# Patient Record
Sex: Female | Born: 1975 | Hispanic: Yes | Marital: Married | Smoking: Never smoker
Health system: Southern US, Community
[De-identification: ages and names within clinical notes are randomized; demographics above are authoritative.]

## PROBLEM LIST (undated history)

## (undated) DIAGNOSIS — E119 Type 2 diabetes mellitus without complications: Secondary | ICD-10-CM

## (undated) HISTORY — PX: CHOLECYSTECTOMY: SHX55

---

## 2006-03-29 ENCOUNTER — Ambulatory Visit: Payer: Self-pay | Admitting: Family Medicine

## 2006-04-11 ENCOUNTER — Ambulatory Visit: Payer: Self-pay | Admitting: Family Medicine

## 2006-04-23 ENCOUNTER — Ambulatory Visit: Payer: Self-pay | Admitting: Family Medicine

## 2006-05-24 ENCOUNTER — Ambulatory Visit: Payer: Self-pay | Admitting: Family Medicine

## 2007-02-28 ENCOUNTER — Emergency Department: Payer: Self-pay | Admitting: Emergency Medicine

## 2011-07-14 ENCOUNTER — Ambulatory Visit: Payer: Self-pay | Admitting: Family Medicine

## 2011-07-16 ENCOUNTER — Emergency Department: Payer: Self-pay | Admitting: Internal Medicine

## 2017-01-22 ENCOUNTER — Ambulatory Visit
Admission: RE | Admit: 2017-01-22 | Discharge: 2017-01-22 | Disposition: A | Discharge status: Home / Self Care | Payer: 59 | Source: Ambulatory Visit | Attending: Obstetrics and Gynecology | Admitting: Obstetrics and Gynecology

## 2017-01-22 ENCOUNTER — Encounter: Payer: Self-pay | Admitting: *Deleted

## 2017-01-22 ENCOUNTER — Other Ambulatory Visit: Payer: Self-pay | Admitting: *Deleted

## 2017-01-22 DIAGNOSIS — O24111 Pre-existing diabetes mellitus, type 2, in pregnancy, first trimester: Secondary | ICD-10-CM | POA: Insufficient documentation

## 2017-01-22 DIAGNOSIS — O99211 Obesity complicating pregnancy, first trimester: Secondary | ICD-10-CM | POA: Diagnosis not present

## 2017-01-22 DIAGNOSIS — O09521 Supervision of elderly multigravida, first trimester: Secondary | ICD-10-CM | POA: Diagnosis not present

## 2017-01-22 DIAGNOSIS — E669 Obesity, unspecified: Secondary | ICD-10-CM | POA: Insufficient documentation

## 2017-01-22 DIAGNOSIS — Z6828 Body mass index (BMI) 28.0-28.9, adult: Secondary | ICD-10-CM | POA: Diagnosis not present

## 2017-01-22 DIAGNOSIS — O24919 Unspecified diabetes mellitus in pregnancy, unspecified trimester: Secondary | ICD-10-CM | POA: Insufficient documentation

## 2017-01-22 DIAGNOSIS — O09529 Supervision of elderly multigravida, unspecified trimester: Secondary | ICD-10-CM

## 2017-01-22 DIAGNOSIS — Z833 Family history of diabetes mellitus: Secondary | ICD-10-CM | POA: Diagnosis not present

## 2017-01-22 DIAGNOSIS — Z3A09 9 weeks gestation of pregnancy: Secondary | ICD-10-CM | POA: Insufficient documentation

## 2017-01-22 HISTORY — DX: Type 2 diabetes mellitus without complications: E11.9

## 2017-01-22 LAB — GLUCOSE, CAPILLARY: GLUCOSE-CAPILLARY: 271 mg/dL — AB (ref 65–99)

## 2017-01-22 MED ORDER — ACCU-CHEK MULTICLIX LANCETS MISC: 5 refills | Status: DC

## 2017-01-22 MED ORDER — INSULIN GLARGINE 100 UNIT/ML ~~LOC~~ SOLN: SUBCUTANEOUS | 3 refills | Status: AC

## 2017-01-22 MED ORDER — INSULIN LISPRO 100 UNIT/ML ~~LOC~~ SOLN: SUBCUTANEOUS | 3 refills | Status: AC

## 2017-01-22 MED ORDER — GLUCAGON (RDNA) 1 MG IJ KIT: PACK | INTRAMUSCULAR | 1 refills | Status: AC

## 2017-01-22 MED ORDER — GLUCOSE BLOOD VI STRP: ORAL_STRIP | 12 refills | Status: DC

## 2017-01-22 MED ORDER — "INSULIN SYRINGE-NEEDLE U-100 29G X 1/2"" 0.3 ML MISC": 4.0000 "application " | Freq: Four times a day (QID) | 3 refills | Status: AC

## 2017-01-22 NOTE — Progress Notes (Signed)
[facility] Consultation   Chief Complaint: diabetes in pregnancy  HPI: [name] is a 41 y.o. G5 p3013   who presents in consultation from  [facility] for T2 DM in prengancy with elevated Hgb A1c 10. LMP 12/03/16 placing her at 7 weeks .   Review of her Care Everywhere shows she was on metformin for her 2014 pregnancy and was switched to  insulin at that time her hgb A1c was 6.4, normal EKG, normal baseline preex labs- no urine protein  , normal ophthal exam then Had a normal SVD EFW had been in 30% ile range and was given OCPs for pp contraception and was restarted on metformin   Pt stopped her metformin and her ACE  (?) inhibitor upon finding out she was pregnant . She is traveling to [place] to visit relatives for 6 weeks 7/12-8/25 . She is not interested in being admitted for glucose control since she feels she learned the skills she needs to care for her diabetes in her last pregnancy. She understands the idea of short and long acting insulin. She repeated how to draw insulin up in a syringe after injecting air into the vial .  We did not review site rotation. Pt understands the idea of hypoglycemia if she injects and does not eat. And she requests a rx for glucagon as she had in the last pregnancy though she never really experienced hypoglycemia in her prior pregnancy.  We discussed that bringing down her sugar may result in feeling shaky even when her blood sugars read in a normal range since she is used to running high.   Past Medical History: Patient has a h/o type 2 DM since 2008 pregnancy Past Surgical History: cholecystectomy 2008 Obstetric History:   2014 SVD 39 2/7 6 lb 10 oz female  epidural  DM on insulin  2012 - sab 2008  38 weeks SVD 6 lb 2oz diabetes (here with her today)  2001 term 38 weeks 7lb 3 oz SVD    Gynecologic History: distant h/o LSIL neg colpo 2008, normal pap 2012 listed on Care everywhere, neg 2009, 2008 , 2007    Medications:  Allergies:  Social History: Patient    Family History: diabetes in father and grandmother  Blood type  -O pos in 2014 , imm to rubella then Review of SystemsPhysical Exam: Vitals:   01/22/17 1100  BP: 124/79  Pulse: 81  Resp: 16  Temp: 98.3 F (36.8 C)  Petite mildly obese latina female  RBS 270  See u/s report  SLIUP at 9w 4d yolk sac at 6mm upper limits of normal  Asessement: IUP at 9w 4d by today's scan  1. Elderly multigravida with antepartum condition or complication   2. Pre-existing type 2 diabetes mellitus during pregnancy in first trimester   3. Obesity (BMI 30-39.9)   4. Distant h/o LSIL  Plan: 1 . Start insulin - pt has used before- will start approximately 0.5 units /kg and increase next week if pt tolerates-  pt declines hospitlaization Will begin with lantus 10 units qhs and administer 5 units humalog  before each meal  Pt given sheets for recording blood sugar I wrote down insulin doses and reviewed with interpreter  2. genetic counseling for AMA next week  3.first tri  U/s next month  - pt at risk for congenital defects due to hgb a1c and her age - offer level 2 scan and fetal echo  Growth scans q 4  w in third trimester and twice weekly testing at 32 w , delivery at 37-39 weeks depending on glucose control , fetal growth .  4 baseline labs including CMP  , EKG, P/C ratio, ophthalmology visit - I did not order - hopefully pt can get through [facility]  Otherwise will order at next visit  5 offer baby aspirin at 5311-14  w  given age and DM  6 Confirm recent pap done - not performed today  I offered to follow her insulin here or referral to either [facility] or [facility] (pt delivered there last time). Pt will f/u in one week and after she gets back from [place] . I reviewed care of insulin - avoiding extreme heat, pt has glucometer, Rx for lancets and strips given - she has [employer] [employer] [facility] - hopefully will cover additional meds and strips for her trip.          Total time spent with the patient was 30 minutes with greater than 50% spent in counseling and coordination of care. We appreciate this interesting consult and will be happy to be involved in the ongoing care of [name] in anyway her obstetricians desire.  Jimmey RalphLivingston, Edan Serratore, MD Maternal-Fetal Medicine [facility]

## 2017-01-29 ENCOUNTER — Ambulatory Visit
Admission: RE | Admit: 2017-01-29 | Discharge: 2017-01-29 | Disposition: A | Discharge status: Home / Self Care | Payer: 59 | Source: Ambulatory Visit | Attending: Obstetrics and Gynecology | Admitting: Obstetrics and Gynecology

## 2017-01-29 ENCOUNTER — Ambulatory Visit (HOSPITAL_BASED_OUTPATIENT_CLINIC_OR_DEPARTMENT_OTHER)
Admission: RE | Admit: 2017-01-29 | Discharge: 2017-01-29 | Disposition: A | Discharge status: Home / Self Care | Payer: 59 | Source: Ambulatory Visit | Attending: Obstetrics and Gynecology | Admitting: Obstetrics and Gynecology

## 2017-01-29 VITALS — BP 121/70 | HR 90 | Temp 98.4°F | Resp 17 | Ht 60.0 in | Wt 150.2 lb

## 2017-01-29 DIAGNOSIS — Z794 Long term (current) use of insulin: Secondary | ICD-10-CM | POA: Insufficient documentation

## 2017-01-29 DIAGNOSIS — Z3A10 10 weeks gestation of pregnancy: Secondary | ICD-10-CM | POA: Diagnosis not present

## 2017-01-29 DIAGNOSIS — O24111 Pre-existing diabetes mellitus, type 2, in pregnancy, first trimester: Secondary | ICD-10-CM | POA: Diagnosis present

## 2017-01-29 DIAGNOSIS — O09521 Supervision of elderly multigravida, first trimester: Secondary | ICD-10-CM | POA: Diagnosis not present

## 2017-01-29 DIAGNOSIS — O09529 Supervision of elderly multigravida, unspecified trimester: Secondary | ICD-10-CM

## 2017-01-29 LAB — GLUCOSE, CAPILLARY: GLUCOSE-CAPILLARY: 254 mg/dL — AB (ref 65–99)

## 2017-01-29 NOTE — Progress Notes (Addendum)
Referring Provider:  [facility] Length of Consultation: 50 minutes  Ms. Kanyon Bunn was referred to [facility] for genetic counseling because of advanced maternal age.  The patient will be 41 years old at the time of delivery.  This note summarizes the information we discussed with the aid of a Spanish interpreter.  The patient was counseled by Andrez Grime, genetic counseling intern, supervised by Katrina Stack, MS, CGC.    We explained that the chance of a chromosome abnormality increases with maternal age.  Chromosomes and examples of chromosome problems were reviewed.  Humans typically have 46 chromosomes in each cell, with half passed through each sperm and egg.  Any change in the number or structure of chromosomes can increase the risk of problems in the physical and mental development of a pregnancy.   Based upon age of the patient, the chance of any chromosome abnormality was 1 in 22. The chance of Down syndrome, the most common chromosome problem associated with maternal age, was 1 in 84.  The risk of chromosome problems is in addition to the 3% general population risk for birth defects and mental retardation.  The greatest chance, of course, is that the baby would be born in good health.  We discussed the following prenatal screening and testing options for this pregnancy:  First trimester screening, which includes nuchal translucency ultrasound screen and first trimester maternal serum marker screening.  The nuchal translucency has approximately an 80% detection rate for Down syndrome and can be positive for other chromosome abnormalities as well as heart defects.  When combined with a maternal serum marker screening, the detection rate is up to 90% for Down syndrome and up to 97% for trisomy 18.     The chorionic villus sampling procedure is available for first trimester chromosome analysis.  This involves the withdrawal of a small amount of  chorionic villi (tissue from the developing placenta).  Risk of pregnancy loss is estimated to be approximately 1 in 200 to 1 in 100 (0.5 to 1%).  There is approximately a 1% (1 in 100) chance that the CVS chromosome results will be unclear.  Chorionic villi cannot be tested for neural tube defects.     Maternal serum marker screening, a blood test that measures pregnancy proteins, can provide risk assessments for Down syndrome, trisomy 18, and open neural tube defects (spina bifida, anencephaly). Because it does not directly examine the fetus, it cannot positively diagnose or rule out these problems.  Targeted ultrasound uses high frequency sound waves to create an image of the developing fetus.  An ultrasound is often recommended as a routine means of evaluating the pregnancy.  It is also used to screen for fetal anatomy problems (for example, a heart defect) that might be suggestive of a chromosomal or other abnormality.   Amniocentesis involves the removal of a small amount of amniotic fluid from the sac surrounding the fetus with the use of a thin needle inserted through the maternal abdomen and uterus.  Ultrasound guidance is used throughout the procedure.  Fetal cells from amniotic fluid are directly evaluated and > 99.5% of chromosome problems and > 98% of open neural tube defects can be detected. This procedure is generally performed after the 15th week of pregnancy.  The main risks to this procedure include complications leading to miscarriage in less than 1 in 200 cases (0.5%).  We also reviewed the availability of cell free fetal DNA testing from maternal blood to  determine whether or not the baby may have either Down syndrome, trisomy 4913, or trisomy 8018.  This test utilizes a maternal blood sample and DNA sequencing technology to isolate circulating cell free fetal DNA from maternal plasma.  The fetal DNA can then be analyzed for DNA sequences that are derived from the three most common  chromosomes involved in aneuploidy, chromosomes 13, 18, and 21.  If the overall amount of DNA is greater than the expected level for any of these chromosomes, aneuploidy is suspected.  While we do not consider it a replacement for invasive testing and karyotype analysis, a negative result from this testing would be reassuring, though not a guarantee of a normal chromosome complement for the baby.  An abnormal result is certainly suggestive of an abnormal chromosome complement, though we would still recommend CVS or amniocentesis to confirm any findings from this testing.  Cystic Fibrosis and Spinal Muscular Atrophy (SMA) screening were also discussed with the patient. Both conditions are recessive, which means that both parents must be carriers in order to have a child with the disease.  Cystic fibrosis (CF) is one of the most common genetic conditions in persons of Caucasian ancestry.  This condition occurs in approximately 1 in 2,500 Caucasian persons and results in thickened secretions in the lungs, digestive, and reproductive systems.  For a baby to be at risk for having CF, both of the parents must be carriers for this condition.  Approximately 1 in 6725 Caucasian persons is a carrier for CF.  Current carrier testing looks for the most common mutations in the gene for CF and can detect approximately 90% of carriers in the Caucasian population.  This means that the carrier screening can greatly reduce, but cannot eliminate, the chance for an individual to have a child with CF.  If an individual is found to be a carrier for CF, then carrier testing would be available for the partner. As part of [place]'s newborn screening profile, all babies born in the state of [place]  will have a two-tier screening process.  Specimens are first tested to determine the concentration of immunoreactive trypsinogen (IRT).  The top 5% of specimens with the highest IRT values then undergo DNA testing using a panel of  over 40 common CF mutations. SMA is a neurodegenerative disorder that leads to atrophy of skeletal muscle and overall weakness.  This condition is also more prevalent in the Caucasian population, with 1 in 40-1 in 60 persons being a carrier and 1 in 6,000-1 in 10,000 children being affected.  There are multiple forms of the disease, with some causing death in infancy to other forms with survival into adulthood.  The genetics of SMA is complex, but carrier screening can detect up to 95% of carriers in the Caucasian population.  Similar to CF, a negative result can greatly reduce, but cannot eliminate, the chance to have a child with SMA.  We obtained a detailed family history and pregnancy history.  The patient reported one paternal uncle and one paternal first cousin with seizures.  The cause for their seizures is not known, but neither has any other health or developmental concerns.We reviewed that seizures may have many causes, including some which may be inherited.  If more is learned about the cause for their conditions, we are happy to discuss this further. She also stated that two of her sons have benign heart murmers.  They have no restrictions or medications and are otherwise in good health. The remainder  of the family history is unremarkable for birth defects, developmental delays, recurrent pregnancy loss or known chromosome abnormalities.  Ms. Tira Lafferty stated that this is her fourth pregnancy.  She and her husband have two sons and a daughter with normal growth and development.  She was also seen today for an MFM consultation due to poorly controlled diabetes.  We reviewed that increased risk for birth defects related to maternal diabetes and the need for a detailed ultrasound, AFP testing for open neural tube defects, and a fetal echocardiogram after [redacted] weeks gestation.    After consideration of the options, Ms. Ermel Verne elected to proceed with cell free fetal DNA testing today.  She  declined CF and SMA carrier screening.  The patient requested that we contact her husband with the cell free DNA results, as she will be out of the country for several weeks.  When she returns in late August, she is scheduled for additional visits here and a detailed anatomy ultrasound.  She may consider amniocentesis as well at that time depending upon the results of her cell free fetal DNA testing.   Ms. Yalissa Fink was encouraged to call with questions or concerns.  We can be contacted at [phone].    Cherly Anderson, MS, CGC

## 2017-01-29 NOTE — Progress Notes (Signed)
[facility]   Chief Complaint: Type 2 diabetes on insulin in pregnancy follow up  HPI: [name] is a 41 y.o. G4P0 at 10w 4  weeks by [facility] u/s on 7/2 with EDC of 08/23/2017  and  who presents in consultation from [facility] for f/u of her blood sugars  I had started her on 10 u lantus 5 units humalog before each meal  Her sugars in her meter are 130 -250  RBS 254   Pt does eat tortillas and traditional [place] foods that she note are high in carbs   Past Medical History: Patient  has a past medical history of Diabetes mellitus without complication (HCC).  Past Surgical History: She  has a past surgical history that includes Cholecystectomy.  Obstetric History:  OB History    Gravida Para Term Preterm AB Living   4         3   SAB TAB Ectopic Multiple Live Births                 Gynecologic History:  Patient's last menstrual period was 11/23/2016.    Medications:   Current Outpatient Prescriptions:  .  glucagon 1 MG injection, Follow package directions for low blood sugar., Disp: 1 each, Rfl: 1 .  glucose blood test strip, Use as instructed 4 times daily, Disp: 100 each, Rfl: 12 .  insulin glargine (LANTUS) 100 UNIT/ML injection, 10 units nightly, Disp: 10 mL, Rfl: 3 .  insulin lispro (HUMALOG) 100 UNIT/ML injection, 5 units before each meal, Disp: 10 mL, Rfl: 3 .  Insulin Syringe-Needle U-100 (B-D INS SYR ULTRAFINE .3CC/29G) 29G X 1/2" 0.3 ML MISC, Inject 4 application into the skin 4 (four) times daily., Disp: 120 each, Rfl: 3 .  Lancets (ACCU-CHEK MULTICLIX) lancets, Use as directed, Disp: 100 each, Rfl: 5 .  Prenatal Vit-Fe Fumarate-FA (PRENATAL MULTIVITAMIN) TABS tablet, Take 1 tablet by mouth daily at 12 noon., Disp: , Rfl:  Allergies: Patient has No Known Allergies.  Social History: Patient  reports that she has never smoked. She has never used smokeless tobacco. She reports that she does not drink alcohol or use drugs.   Family History: family history includes Diabetes in her father.  Review of Systems A full 12 point review of systems was negative or as noted in the History of Present Illness.  Physical Exam: LMP 11/23/2016  RBS 254 Informal scan FHR 150  Asessement: 1. Elderly multigravida with antepartum condition or complication   2. Pre-existing type 2 diabetes mellitus during pregnancy in first trimester     Plan: We discussed consistent diet avoiding  simple carbs and limiting quantity of complex carbs  , charting her glucoses to  Look for trends I recommended we increase her insulin.  Pt was anxious to increase insulin too quickly so I dropped increase  We compromised on  14 units lantus 8/8/8 humalog  I reviewed target blood sugars <120-130 after meals , < 100 fbs  Pt will be in [place] over next 4-6 weeks  I suggested she could  increase insulin dose by 2 across the day if her blood sugars remain in the 200s (ie 16 lantus 05/02/09 of humalog )  I reviewed the danger of using too much insulin and dropping the blood sugar too low and the idea is to cover trends in the blood sugar .  I recommended consistent physical activity like walking to assist in glycemic control. I told her if  her sugars went into the high 200s 300s she should seek medical care there in [place]. Likewise if she experiences bleeding she should seek care.   Total time spent with the patient was 30 minutes with greater than 50% spent in counseling and coordination of care. We appreciate this interesting consult and will be happy to be involved in the ongoing care of [name] in anyway her obstetricians desire.  Jimmey RalphLivingston, Sujata Maines, MD Maternal-Fetal Medicine [facility]

## 2017-02-02 LAB — INFORMASEQ(SM) WITH XY ANALYSIS
FETAL FRACTION (%): 4.6
FETAL NUMBER: 1
GESTATIONAL AGE AT COLLECTION: 10.6 wk
Weight: 150 [lb_av]

## 2017-02-05 ENCOUNTER — Ambulatory Visit: Payer: 59

## 2017-02-05 ENCOUNTER — Telehealth: Payer: Self-pay | Admitting: Obstetrics and Gynecology

## 2017-02-05 NOTE — Telephone Encounter (Signed)
The patient's husband, Arlyss RepressMoises, was informed of the results of her recent InformaSeq testing (performed at [facility]) which yielded NEGATIVE results.  The patient's specimen showed DNA consistent with two copies of chromosomes 21, 18 and 13.  The sensitivity for trisomy 2021, trisomy 5518 and trisomy 6213 using this testing are reported as 99.1%, 98.3% and 98.1% respectively.  Thus, while the results of this testing are highly accurate, they are not considered diagnostic at this time.  Should more definitive information be desired, the patient may still consider amniocentesis.   As requested to know by the patient, sex chromosome analysis was included for this sample.  Results was consistent with a female (XY) fetus. This is predicted with >99% accuracy.  A maternal serum AFP only should be considered if screening for neural tube defects is desired.  The patient requested we provide the results to her husband, as she is currently out of the country for several weeks.  We spoke with Moises with the aid of a Spanish interpreter.  Cherly Andersoneborah F. Maecyn Panning, MS, CGC

## 2017-03-15 ENCOUNTER — Other Ambulatory Visit: Payer: Self-pay | Admitting: *Deleted

## 2017-03-15 DIAGNOSIS — O09529 Supervision of elderly multigravida, unspecified trimester: Secondary | ICD-10-CM

## 2017-03-15 DIAGNOSIS — O24912 Unspecified diabetes mellitus in pregnancy, second trimester: Secondary | ICD-10-CM

## 2017-03-19 ENCOUNTER — Ambulatory Visit
Admission: RE | Admit: 2017-03-19 | Discharge: 2017-03-19 | Disposition: A | Discharge status: Home / Self Care | Payer: 59 | Source: Ambulatory Visit | Attending: Obstetrics & Gynecology | Admitting: Obstetrics & Gynecology

## 2017-03-19 DIAGNOSIS — Z3A17 17 weeks gestation of pregnancy: Secondary | ICD-10-CM | POA: Insufficient documentation

## 2017-03-19 DIAGNOSIS — O24912 Unspecified diabetes mellitus in pregnancy, second trimester: Secondary | ICD-10-CM | POA: Insufficient documentation

## 2017-03-19 DIAGNOSIS — O09529 Supervision of elderly multigravida, unspecified trimester: Secondary | ICD-10-CM | POA: Insufficient documentation

## 2017-03-19 DIAGNOSIS — O24919 Unspecified diabetes mellitus in pregnancy, unspecified trimester: Secondary | ICD-10-CM | POA: Diagnosis present

## 2017-03-19 LAB — GLUCOSE, CAPILLARY: GLUCOSE-CAPILLARY: 255 mg/dL — AB (ref 65–99)

## 2017-03-19 LAB — HEMOGLOBIN A1C
Hgb A1c MFr Bld: 7.8 % — ABNORMAL HIGH (ref 4.8–5.6)
Mean Plasma Glucose: 177.16 mg/dL

## 2017-03-29 ENCOUNTER — Ambulatory Visit
Admission: RE | Admit: 2017-03-29 | Discharge: 2017-03-29 | Disposition: A | Discharge status: Home / Self Care | Payer: 59 | Source: Ambulatory Visit | Attending: Maternal & Fetal Medicine | Admitting: Maternal & Fetal Medicine

## 2017-03-29 VITALS — BP 119/67 | HR 94 | Temp 98.2°F | Resp 18 | Wt 158.6 lb

## 2017-03-29 DIAGNOSIS — O24919 Unspecified diabetes mellitus in pregnancy, unspecified trimester: Secondary | ICD-10-CM

## 2017-03-29 NOTE — Progress Notes (Signed)
MFM note addendum  FHR 150s  Visit was completed with assistance of spanish medical interpreter.

## 2017-03-29 NOTE — Progress Notes (Signed)
MFM follow up consultation  41 yo G4P0 at 19 weeks by [facility] on [facility]/2 with EDC of 08/23/17 here for consultation due to T2 DM.  She was in [place] for 4 weeks during this pregnancy--returned on Aug. 25th--[place]. She reports multiple mosquito bites. Works in [employer] and arrives at work at [employer]---having difficulty checking blood glucose post breakfast.   On lantus 15 u and Humalog 07/05/11   Fastings 110--133  NOT recording post breakfast  Post lunch  122-185 (10/11 out of range) Post dinner 144-165     1. Recommended she take breakfast earlier in am (wakes at 530a daily) and check blood glucose prior to leaving house.  -- Insulin adjustments: increase evening lantus to 20u Increase humalog to 07/09/15 RTC in one week for blood glucose review --Needs follow up growth scan in ~10 weeks  --Will need fetal echocardiogram ~22weeks--did not want to schedule yet due to concerns for payment --She did not have follow up visit at [facility] but we called to make appt--she will obtain referral to Obgyn for prenatal care ([facility])  2. ZKV screening---please obtain ZIKV screening as part of her PN exposure--we discussed today (given her recent travel/mosquito bite exposure) and she is amenable to testing.   --if positive, we can address further surveillance with patient

## 2017-04-05 ENCOUNTER — Ambulatory Visit: Payer: 59

## 2017-04-05 ENCOUNTER — Ambulatory Visit
Admission: RE | Admit: 2017-04-05 | Discharge: 2017-04-05 | Disposition: A | Discharge status: Home / Self Care | Payer: 59 | Source: Ambulatory Visit | Attending: Maternal & Fetal Medicine | Admitting: Maternal & Fetal Medicine

## 2017-04-05 ENCOUNTER — Telehealth: Payer: Self-pay | Admitting: *Deleted

## 2017-04-05 VITALS — BP 112/66 | HR 96 | Temp 98.7°F | Resp 18

## 2017-04-05 DIAGNOSIS — Z794 Long term (current) use of insulin: Secondary | ICD-10-CM | POA: Insufficient documentation

## 2017-04-05 DIAGNOSIS — E669 Obesity, unspecified: Secondary | ICD-10-CM

## 2017-04-05 DIAGNOSIS — O09529 Supervision of elderly multigravida, unspecified trimester: Secondary | ICD-10-CM

## 2017-04-05 DIAGNOSIS — Z3A20 20 weeks gestation of pregnancy: Secondary | ICD-10-CM | POA: Diagnosis not present

## 2017-04-05 DIAGNOSIS — O24912 Unspecified diabetes mellitus in pregnancy, second trimester: Secondary | ICD-10-CM | POA: Insufficient documentation

## 2017-04-05 DIAGNOSIS — O24919 Unspecified diabetes mellitus in pregnancy, unspecified trimester: Secondary | ICD-10-CM

## 2017-04-05 LAB — GLUCOSE, CAPILLARY: GLUCOSE-CAPILLARY: 133 mg/dL — AB (ref 65–99)

## 2017-04-05 NOTE — Telephone Encounter (Signed)
Called [facility] to schedule prenatal appt. [facility].  Appointment scheduled for 04/06/2017 at 10:45.  Patient aware.

## 2017-04-05 NOTE — Progress Notes (Signed)
MFM follow up consultation  41 yo G5P3013 at 20 weeks by [facility]  [facility]; measurements were consistent with 9 weeks 4 days.  She's  here for follow up consultation due to T2DM.  She is currently on Lantus 20u at HS and Humalog 07/09/15  FHR 130s   Vitals:   04/05/17 0839  BP: 112/66  Pulse: 96  Resp: 18  Temp: 98.7 F (37.1 C)  SpO2: 98%   Current Outpatient Prescriptions on File Prior to Encounter  Medication Sig Dispense Refill  . glucagon 1 MG injection Follow package directions for low blood sugar. 1 each 1  . glucose blood test strip Use as instructed 4 times daily 100 each 12  . insulin aspart (NOVOLOG) 100 UNIT/ML injection Inject 12 Units into the skin 3 (three) times daily before meals.    . insulin detemir (LEVEMIR) 100 UNIT/ML injection Inject 15 Units into the skin at bedtime.    . Insulin Syringe-Needle U-100 (B-D INS SYR ULTRAFINE .3CC/29G) 29G X 1/2" 0.3 ML MISC Inject 4 application into the skin 4 (four) times daily. 120 each 3  . Lancets (ACCU-CHEK MULTICLIX) lancets Use as directed 100 each 5  . Prenatal Vit-Fe Fumarate-FA (PRENATAL MULTIVITAMIN) TABS tablet Take 1 tablet by mouth daily at 12 noon.    . insulin glargine (LANTUS) 100 UNIT/ML injection 10 units nightly (Patient not taking: Reported on 03/19/2017) 10 mL 3  . insulin lispro (HUMALOG) 100 UNIT/ML injection 5 units before each meal (Patient not taking: Reported on 03/19/2017) 10 mL 3   No current facility-administered medications on file prior to encounter.     Fastings 97-115  PB 131, 147, 143, 126, 140, 134 PL  146, 152, 130, 137, 106 PD  267, 144, 126, 140, 196   1. T2 DM--glycemic  control improved compared to last week-- Insulin adjustments today: Increase Lantus to 24u at HS Humalog dosing increased to 07/08/17 She will still need fetal echocardiogram scheduled  Appointment made for [facility] for [facility]--Please obtain baseline labs: CMP, ECG, P;C  ratio and opthalmology referral.   She will need referral to either a local OBGYN practice or [facility]) Please confirm patient taking baby aspirin daily for preeclampsia risk reduction due to DM and maternal age 41.  2. ZIKA virus screening--pt had recent exposure please obtain with labs.

## 2017-04-19 ENCOUNTER — Ambulatory Visit: Payer: 59

## 2017-05-03 ENCOUNTER — Ambulatory Visit: Payer: 59

## 2017-11-15 ENCOUNTER — Encounter (HOSPITAL_COMMUNITY): Payer: Self-pay

## 2018-05-08 IMAGING — US US MFM OB DETAIL+14 WK
1 series · 12 of 28 positions shown · non-contrast
Comparison: none

PATIENT INFO:

PERFORMED BY:
SERVICE(S) PROVIDED:
INDICATIONS:
17 weeks gestation of pregnancy
AMA
Type 2 DM
FETAL EVALUATION:
Num Of Fetuses:     1
Fetal Heart         153
Rate(bpm):
Cardiac Activity:   Present
Fetal Lie:          Maternal Left
Presentation:       Breech
Placenta:           Posterior Grade 0, No previa
P. Cord Insertion:  Normal
BIOMETRY:
BPD:      38.4  mm     G. Age:  17w 5d         56  %    CI:        70.09   %    70 - 86
FL/HC:      17.3   %    15.8 - 18
HC:      146.3  mm     G. Age:  17w 6d         53  %    HC/AC:      1.18        1.07 -
AC:      124.3  mm     G. Age:  18w 0d         64  %    FL/BPD:     65.9   %
FL:       25.3  mm     G. Age:  17w 5d         49  %    FL/AC:      20.4   %    20 - 24
HUM:      24.4  mm     G. Age:  17w 4d         57  %
CER:        17  mm     G. Age:  17w 0d         37  %
NFT:       2.8  mm
CM:        3.1  mm
Est. FW:     213  gm      0 lb 8 oz     59  %
GESTATIONAL AGE:
LMP:           16w 4d        Date:  11/23/16                 EDD:   08/30/17
U/S Today:     17w 6d                                        EDD:   08/21/17
Best:          17w 4d     Det. By:  U/S C R L  (01/22/17)    EDD:   08/23/17
ANATOMY:
Cranium:               Within Normal Limits   Aortic Arch:            Normal appearance
Cavum:                 CSP visualized         Ductal Arch:            Normal appearance
Ventricles:            Normal appearance      Diaphragm:              Within Normal Limits
Choroid Plexus:        Within Normal Limits   Stomach:                Seen
Cerebellum:            Within Normal Limits   Abdomen:                Within Normal
Limits
Posterior Fossa:       Within Normal Limits   Abdominal Wall:         Normal appearance
Nuchal Fold:           Within Normal Limits   Cord Vessels:           3 vessels
Face:                  Orbits visualized      Kidneys:                Normal appearance
Lips:                  Normal appearance      Bladder:                Seen
Thoracic:              Within Normal Limits   Spine:                  Normal appearance
Heart:                 4-Chamber view         Upper Extremities:      Visualized
appears normal
RVOT:                  Normal appearance      Lower Extremities:      Visualized
LVOT:                  Normal appearance
CERVIX UTERUS ADNEXA:
Cervix
Length:           3.66  cm.

[Series 1: us mfm ob detail+14 wk · 0.22mm/px · 72 acquisitions, 12 frames shown]
[im 3/72]
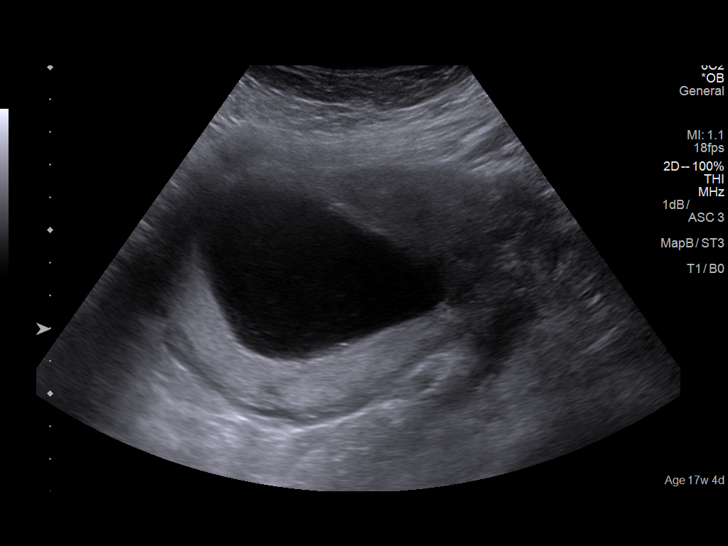
[im 8/72]
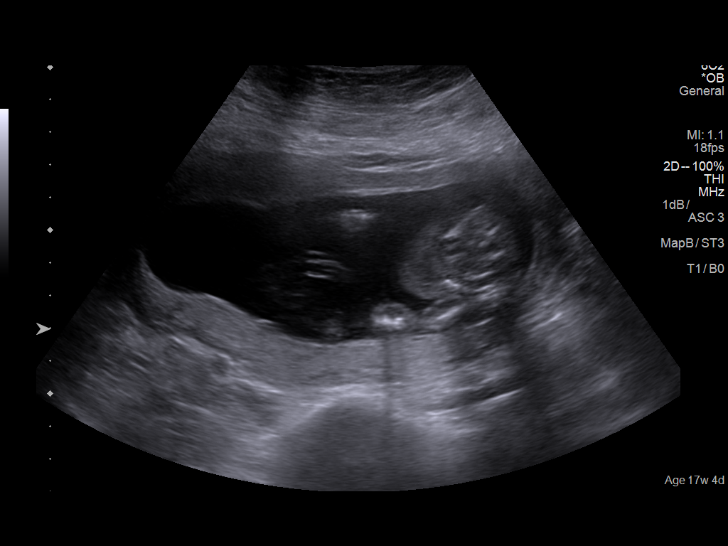
[im 14/72]
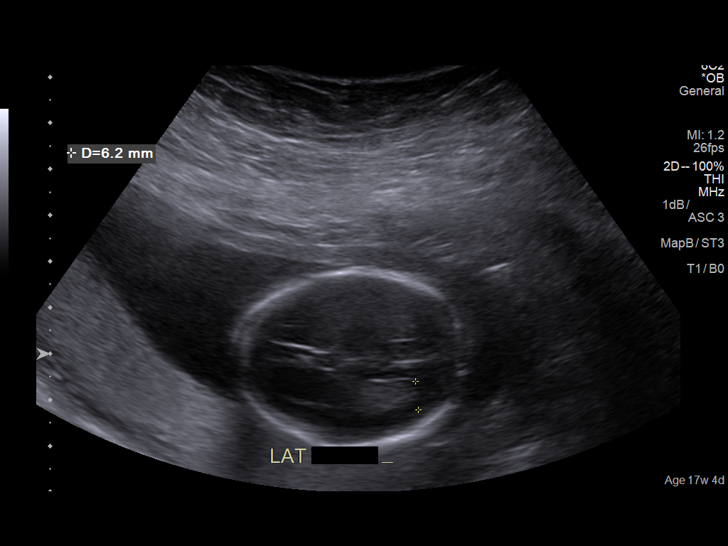
[im 22/72]
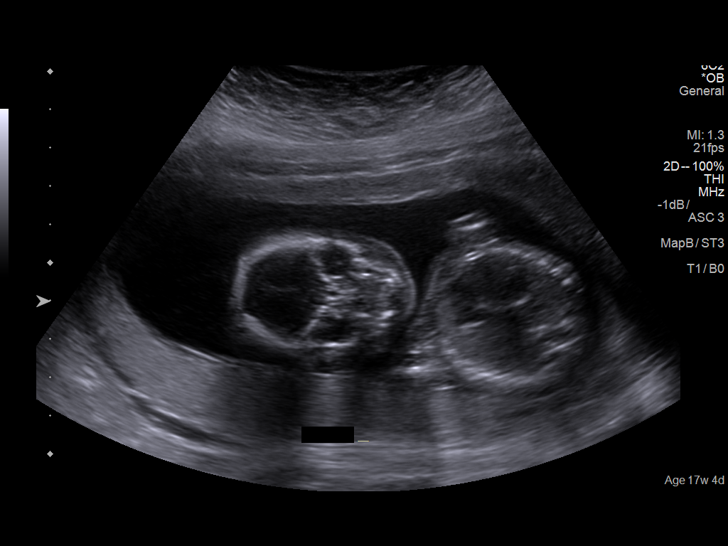
[im 27/72]
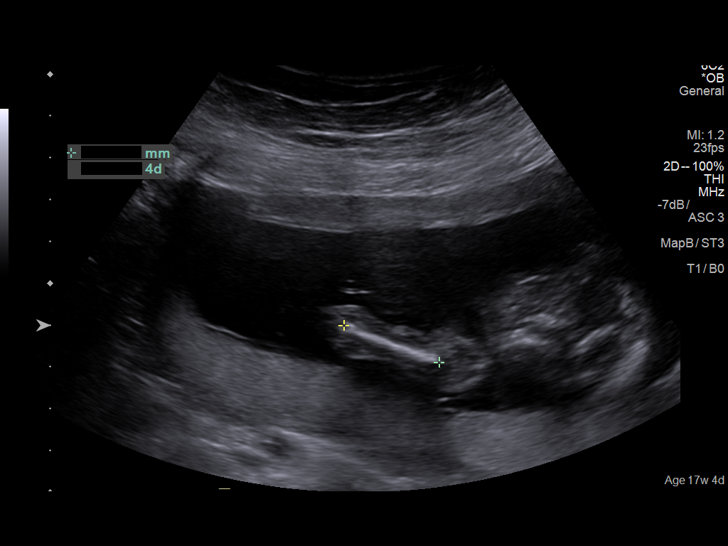
[im 32/72]
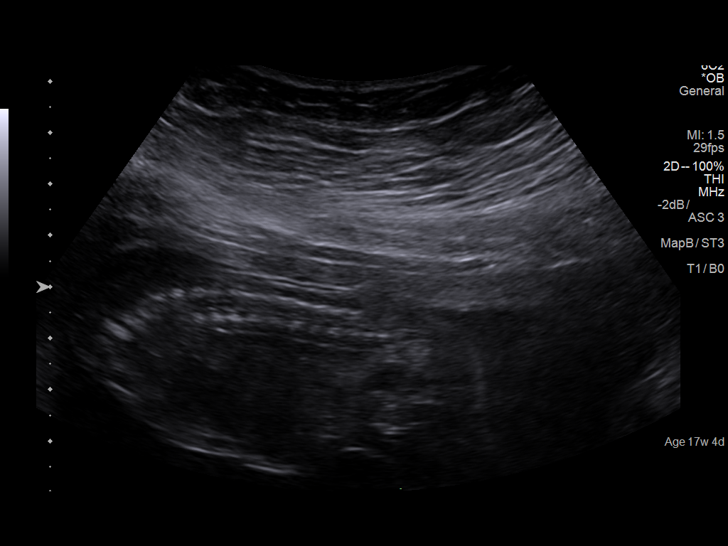
[im 40/72]
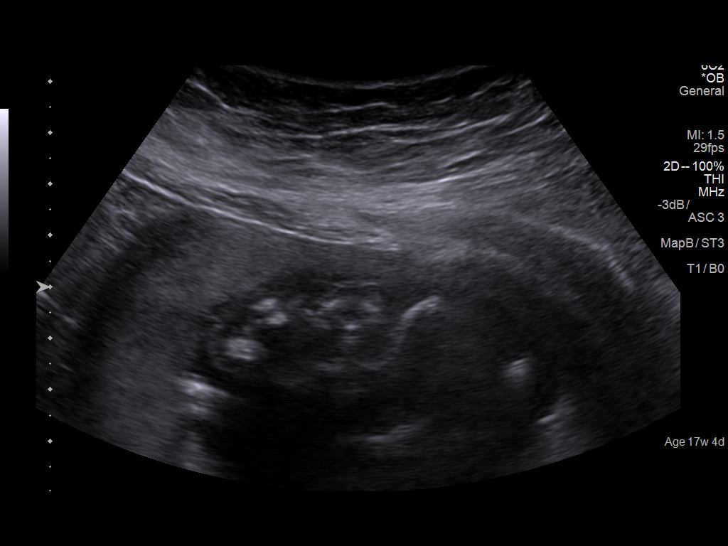
[im 45/72]
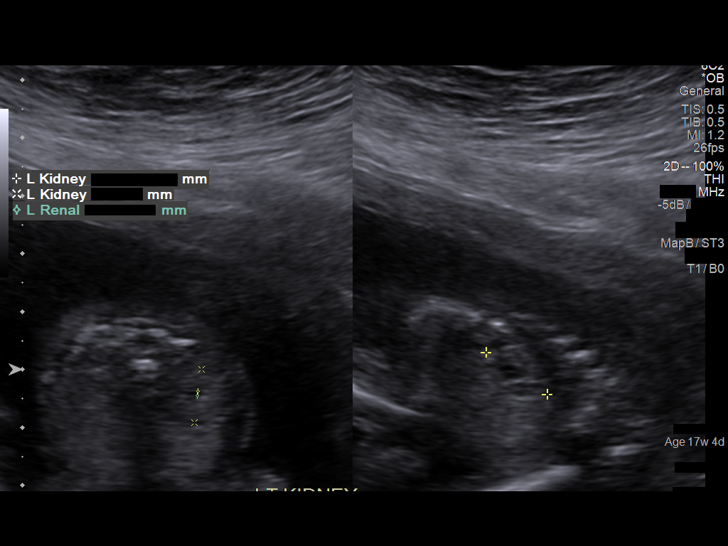
[im 50/72]
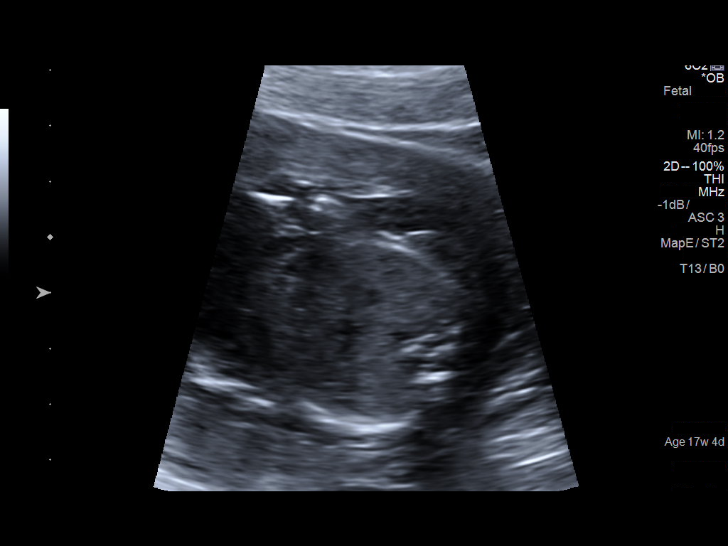
[im 58/72]
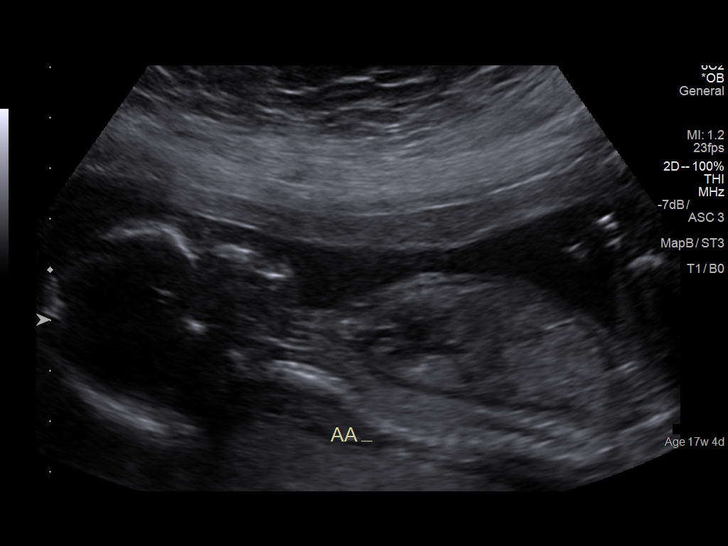
[im 64/72]
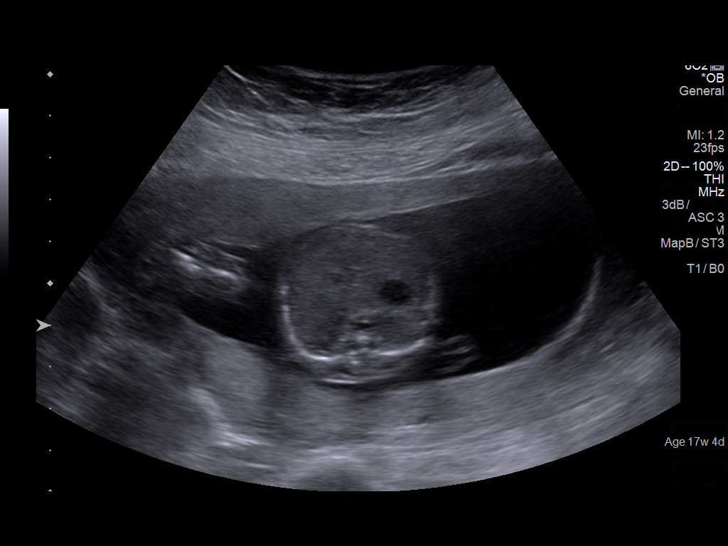
[im 69/72]
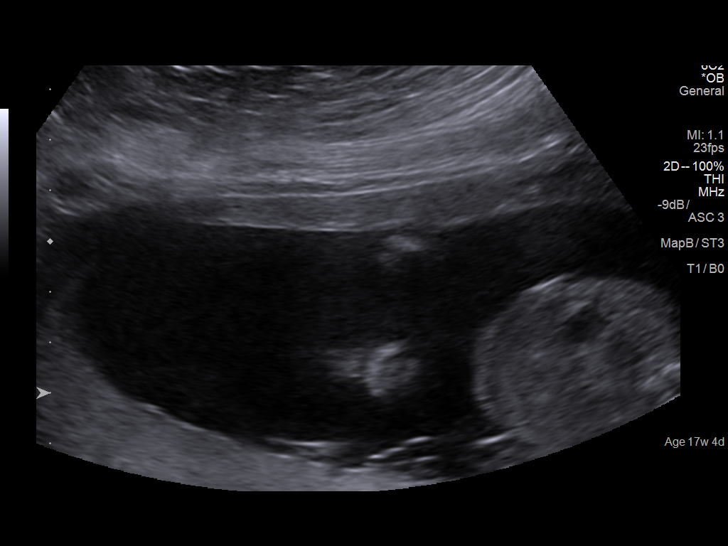

[12 of 28 positions shown; findings below may reference images not displayed]

IMPRESSION: Ultrasound demonstrates a single live pregnancy at 17 [DATE]
weeks. Dating is by earliest available ultrasound performed
on 01/22/17 at [facility] [HOSPITAL] with measurements of
9 [DATE] weeks.

The visualized fetal anatomy appears normal.  The amniotic
fluid volume is normal and there is no placenta previa.

The patient was seen with an interpreter and the findings
discussed.  She just returned from [REDACTED]. While there, she
was taking her insulin (Lantus 15 Units, Humalog 07/05/11)
but was not checking her sugars.   A spot sugar check today
was 255. We provided her with a new log, instructed her on
how to administer her insulin and when to check her sugars.
She will return in one week for us to review her log.  An A1C
was sent today. She declined scheduling a fetal echo.  Will
schedule a followup scan to be done in six weeks.

She had previously completed genetic counseling and
elected for cell-free fetal DNA testing, which was normal. She
does not desire amniocentesis.

## 2019-06-18 ENCOUNTER — Other Ambulatory Visit: Payer: Self-pay

## 2019-06-18 DIAGNOSIS — Z20822 Contact with and (suspected) exposure to covid-19: Secondary | ICD-10-CM

## 2019-06-19 LAB — NOVEL CORONAVIRUS, NAA: SARS-CoV-2, NAA: DETECTED — AB

## 2019-06-20 ENCOUNTER — Ambulatory Visit: Payer: Self-pay | Admitting: *Deleted

## 2019-06-20 NOTE — Telephone Encounter (Signed)
See result note.  

## 2019-06-23 ENCOUNTER — Telehealth: Payer: Self-pay

## 2019-06-23 NOTE — Telephone Encounter (Signed)
Patient son states that had a video visit and that they were suppose to send in medication. I couldn't find the the visit so I advise patient to contact pcp. Patient verbalized understanding

## 2019-10-11 ENCOUNTER — Ambulatory Visit: Payer: Self-pay | Attending: Internal Medicine

## 2019-10-11 DIAGNOSIS — Z23 Encounter for immunization: Secondary | ICD-10-CM

## 2019-10-11 NOTE — Progress Notes (Signed)
   Covid-19 Vaccination Clinic  Name:  Hailey Wise    MRN: [id number] DOB: 1976-05-17  10/11/2019  [name] was observed post Covid-19 immunization for 15 minutes without incident. She was provided with Vaccine Information Sheet and instruction to access the V-Safe system.   [name] was instructed to call 911 with any severe reactions post vaccine: Marland Kitchen Difficulty breathing  . Swelling of face and throat  . A fast heartbeat  . A bad rash all over body  . Dizziness and weakness   Immunizations Administered    Name Date Dose VIS Date Route   Pfizer COVID-19 Vaccine 10/11/2019  8:36 AM 0.3 mL 07/04/2019 Intramuscular   Manufacturer: [employer], [employer]   Lot: [id number]   NDC: [address]-5

## 2019-11-01 ENCOUNTER — Ambulatory Visit: Payer: Self-pay | Attending: Internal Medicine

## 2019-11-01 DIAGNOSIS — Z23 Encounter for immunization: Secondary | ICD-10-CM

## 2019-11-01 NOTE — Progress Notes (Signed)
   Covid-19 Vaccination Clinic  Name:  Hailey Wise    MRN: [id number] DOB: September 29, 1975  11/01/2019  [name] was observed post Covid-19 immunization for 15 minutes without incident. She was provided with Vaccine Information Sheet and instruction to access the V-Safe system.   [name] was instructed to call 911 with any severe reactions post vaccine: Marland Kitchen Difficulty breathing  . Swelling of face and throat  . A fast heartbeat  . A bad rash all over body  . Dizziness and weakness   Immunizations Administered    Name Date Dose VIS Date Route   Pfizer COVID-19 Vaccine 11/01/2019  8:14 AM 0.3 mL 07/04/2019 Intramuscular   Manufacturer: [employer], [employer]   Lot: [id number]   NDC: [address]-9

## 2022-01-23 ENCOUNTER — Other Ambulatory Visit: Payer: Self-pay | Admitting: Physician Assistant

## 2022-01-23 ENCOUNTER — Ambulatory Visit
Admission: RE | Admit: 2022-01-23 | Discharge: 2022-01-23 | Disposition: A | Discharge status: Home / Self Care | Payer: 59 | Attending: Physician Assistant | Admitting: Physician Assistant

## 2022-01-23 ENCOUNTER — Ambulatory Visit
Admission: RE | Admit: 2022-01-23 | Discharge: 2022-01-23 | Disposition: A | Discharge status: Home / Self Care | Payer: 59 | Source: Ambulatory Visit | Attending: Physician Assistant | Admitting: Physician Assistant

## 2022-01-23 DIAGNOSIS — M25819 Other specified joint disorders, unspecified shoulder: Secondary | ICD-10-CM

## 2022-01-23 DIAGNOSIS — M7541 Impingement syndrome of right shoulder: Secondary | ICD-10-CM | POA: Diagnosis not present

## 2022-01-23 DIAGNOSIS — M25511 Pain in right shoulder: Secondary | ICD-10-CM | POA: Insufficient documentation

## 2022-10-24 ENCOUNTER — Other Ambulatory Visit: Payer: Self-pay | Admitting: Family Medicine

## 2022-10-24 DIAGNOSIS — Z1231 Encounter for screening mammogram for malignant neoplasm of breast: Secondary | ICD-10-CM

## 2023-08-15 ENCOUNTER — Ambulatory Visit
Admission: RE | Admit: 2023-08-15 | Discharge: 2023-08-15 | Disposition: A | Discharge status: Home / Self Care | Payer: 59 | Source: Ambulatory Visit | Attending: Family Medicine | Admitting: Family Medicine

## 2023-08-15 DIAGNOSIS — Z1231 Encounter for screening mammogram for malignant neoplasm of breast: Secondary | ICD-10-CM | POA: Diagnosis present

## 2023-08-17 ENCOUNTER — Other Ambulatory Visit: Payer: Self-pay | Admitting: Family Medicine

## 2023-08-17 DIAGNOSIS — R928 Other abnormal and inconclusive findings on diagnostic imaging of breast: Secondary | ICD-10-CM

## 2023-08-28 ENCOUNTER — Other Ambulatory Visit: Payer: Self-pay | Admitting: Obstetrics and Gynecology

## 2023-08-28 DIAGNOSIS — R928 Other abnormal and inconclusive findings on diagnostic imaging of breast: Secondary | ICD-10-CM

## 2023-10-15 ENCOUNTER — Ambulatory Visit
Admission: RE | Admit: 2023-10-15 | Discharge: 2023-10-15 | Disposition: A | Discharge status: Home / Self Care | Source: Ambulatory Visit | Attending: Family Medicine | Admitting: Family Medicine

## 2023-10-15 DIAGNOSIS — R928 Other abnormal and inconclusive findings on diagnostic imaging of breast: Secondary | ICD-10-CM | POA: Insufficient documentation

## 2023-10-18 ENCOUNTER — Other Ambulatory Visit: Payer: Self-pay | Admitting: Family Medicine

## 2023-10-18 DIAGNOSIS — R221 Localized swelling, mass and lump, neck: Secondary | ICD-10-CM

## 2023-10-23 ENCOUNTER — Ambulatory Visit
Admission: RE | Admit: 2023-10-23 | Discharge: 2023-10-23 | Disposition: A | Discharge status: Home / Self Care | Source: Ambulatory Visit | Attending: Family Medicine | Admitting: Family Medicine

## 2023-10-23 DIAGNOSIS — R221 Localized swelling, mass and lump, neck: Secondary | ICD-10-CM | POA: Diagnosis present

## 2024-03-28 ENCOUNTER — Other Ambulatory Visit: Payer: Self-pay | Admitting: Family Medicine

## 2024-03-28 DIAGNOSIS — N6489 Other specified disorders of breast: Secondary | ICD-10-CM

## 2024-05-08 NOTE — Progress Notes (Signed)
 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)  Molecular:Tempus xT Tempus xT/xR (Tumor DNA and RNA sequencing)   Gene Variant Name DNA Change Type Molecular Consequence Variant Classification  KEAP1 p.S102L - c.305C>T Missense variant - LOF   Pathogenic  MTAP MTAP Copy number loss Copy number loss  Pathogenic  TP53 p.Y163C - c.488A>G Missense variant - LOF   Pathogenic  AKT1 p.E17K - c.49G>A Splice region variant - GOF   Pathogenic  NSD1 p.E322* - c.964G>T Stop gain - LOF   Pathogenic  DNMT3A DNMT3A Splice region variant - LOF   Pathogenic  CTNNB1 p.T41I - c.122C>T Missense variant - GOF   Pathogenic  EGFR p.R252H - c.755G>A Missense variant (exon 7) - GOF   Pathogenic  CDKN2A CDKN2A Copy number loss Copy number loss  Pathogenic  CDKN2B CDKN2B Copy number loss Copy number loss  Pathogenic   Oncologic History:  - 10/25/2023 CT Chest w contrast: left upper lobe lung cancer w/multi station mediastinal and hilar LAN, right-sided pulmonary nodules, hypo attenuating liver lesion - 10/27/2023 MRI Brain wwo contrast: no intracranial metastatic disease - 10/29/2023 PET/CT: hypermetabolic disease in bilateral lung, mediastinal, supraclavicular nodes, single hepatic metastasis - 10/31/2023 right supraclavicular lymph node: non-small cell carcinoma, consistent with lung adenocarcinoma (TTF-1+, p40-) - 11/03/2023 CT Abdomen/Pelvis w contrast: 1.2 cm hypodense hepatic lesion - 11/03/2023 carboplatin/pemetrexed - 02/06/2024 docetaxel + ramucirumab - 03/17/24 CT C/A/P shows increase in LUL mass and suprahilar LN, disease progression in hepatic lobe lesions, bilatearl ovarian mets and retroperitoneal adenopathy - 03/20/24- C1D1 gemcitabine History of Present Illness Hailey Wise is a 48 year old female  with metastatic non-small cell lung cancer who presents for a toxicity check and goals of care discussion.  She experiences significant fatigue and difficulty with oral intake, including a sensation of obstruction when drinking water and a cold feeling in her stomach. She can swallow pills slowly with her head down but struggles with nutritional intake, relying on protein supplements that cause discomfort during bowel movements.  Pain is present due to abdominal masses, exacerbated by movement and synchronized with her heartbeat. Oxycodone  provides temporary relief - currently taking oxycodone  5mg . She has a cough affecting her voice and contributing to fatigue, with concerns about her oxygen levels and a perceived need for supplemental oxygen. She is not able to walk far without getting short of breath and used wheelchair to get to clinic today.  They have a visit scheduled at [facility] next week for second opinion regarding treatment options.  Review of Systems: All systems have been reviewed, pertinent positives and negatives per history of present illness, otherwise deemed unremarkable.  No Known Allergies  Current Outpatient Medications:  .  acetaminophen  (TYLENOL ) 325 MG tablet, Take 2 tablets (650 mg total) by mouth every six (6) hours as needed for pain., Disp: 60 tablet, Rfl: 0 .  alum-mag-simeth (MAALOX PLUS) 200-200-20 mg/5 mL Susp, Mix equal parts diphenhydramine, lidocaine, and liquid antacid. Swish 5 to 10mLs in mouth for 60 seconds, every 4 to 6 hours as needed, then swallow or spit mixture out (as directed by prescriber). Shake well before using. Refrigerate after mixing and discard after 14 days., Disp: 355 mL, Rfl: 0 .  benzonatate (TESSALON) 200 MG capsule, Take 1 capsule (200 mg total) by mouth Three (3) times a day as needed  for cough., Disp: 90 capsule, Rfl: 0 .  blood sugar diagnostic (TRUETRACK TEST) Strp, Frequency:QID   Dosage:0.0     Instructions:  Note:Dose: 1, Disp: , Rfl:  .   cetirizine (ZYRTEC) 10 MG tablet, Take 1 tablet (10 mg total) by mouth in the morning., Disp: , Rfl:  .  codeine-guaiFENesin (GUAIFENESIN AC) 10-100 mg/5 mL liquid, Take 5 mL by mouth Three (3) times a day as needed for cough., Disp: 237 mL, Rfl: 0 .  diphenhydrAMINE (BENADRYL) 12.5 mg/5 mL liquid, Mix equal parts diphenhydramine, lidocaine, and liquid antacid. Swish 5 to 10mLs in mouth for 60 seconds, every 4 to 6 hours as needed, then swallow or spit mixture out (as directed by prescriber). Shake well before using. Refrigerate after mixing and discard after 14 days., Disp: 118 mL, Rfl: 0 .  famotidine (PEPCID) 20 MG tablet, Take 1 tablet (20 mg total) by mouth two (2) times a day., Disp: 60 tablet, Rfl: 2 .  glucagon  (GLUCAGON , HUMAN RECOMBINANT,) 1 mg Kit, 1 mg. Frequency:PHARMDIR   Dosage:1   MG  Instructions:  Note:To be given by family member in hypoglycemic emergency Dose: 1MG , Disp: , Rfl:  .  glucose 4 GM chewable tablet, Chew 4 tablets (16 g total) every ten (10) minutes as needed for low blood sugar ((For Blood Glucose LESS than 70 mg/dL and GREATER than or EQUAL to 54 mg/dL and able to take by mouth))., Disp: 50 tablet, Rfl: 12 .  hydrocortisone 2.5 % cream, , Disp: , Rfl:  .  insulin  NPH isoph U-100 human (HUMULIN) 100 unit/mL (3 mL) injection pen, Inject 20 Units under the skin daily AND 8 Units nightly., Disp: 15 mL, Rfl: 0 .  lidocaine (XYLOCAINE) 2 % Soln, Use 15 mL by Mouth route every four (4) hours as needed (mouth pain)., Disp: 100 mL, Rfl: 0 .  lidocaine (XYLOCAINE) 2 % Soln, Mix equal parts diphenhydramine, lidocaine, and liquid antacid. Swish 5 to 10mLs in mouth for 60 seconds, every 4 to 6 hours as needed, then swallow or spit mixture out (as directed by prescriber). Shake well before using. Refrigerate after mixing and discard after 14 days., Disp: 100 mL, Rfl: 1 .  metFORMIN (GLUCOPHAGE) 500 MG tablet, Take 1 tablet (500 mg total) by mouth in the morning and 1 tablet (500 mg  total) in the evening. Take with meals., Disp: , Rfl:  .  metFORMIN (GLUCOPHAGE) 500 MG tablet, Take 1 tablet (500 mg total) by mouth in the morning and 1 tablet (500 mg total) in the evening. Take with meals., Disp: 60 tablet, Rfl: 11 .  OLANZapine (ZYPREXA) 5 MG tablet, Take 1 tablet (5 mg total) by mouth nightly., Disp: 30 tablet, Rfl: 0 .  oxyCODONE  (ROXICODONE ) 5 MG immediate release tablet, Take 2 tablets (10 mg total) by mouth every four (4) hours as needed for pain., Disp: 90 tablet, Rfl: 0 .  pen needle, diabetic 32 gauge x 5/32 (4 mm) Ndle, Use with insulin  up to 4 times/day as needed., Disp: 100 each, Rfl: 0 .  predniSONE (DELTASONE) 10 MG tablet, Take 1 tablet (10 mg total) by mouth daily., Disp: 30 tablet, Rfl: 0 .  pregabalin (LYRICA) 25 MG capsule, Take 1 capsule (25 mg total) by mouth Three (3) times a day., Disp: 90 capsule, Rfl: 2 .  prochlorperazine (COMPAZINE) 10 MG tablet, Take 1 tablet (10 mg total) by mouth every six (6) hours as needed (Nausea/Vomiting)., Disp: 30 tablet, Rfl: 2 .  TRUEPLUS INSULIN   0.5 mL 30 gauge x 5/16 (8 mm) Syrg, , Disp: , Rfl:   Past Medical History:  Diagnosis Date  . Diabetes mellitus (CMS-HCC)   . Diabetes mellitus type 2 in nonobese (CMS-HCC)    Social History   Tobacco Use  . Smoking status: Never  . Smokeless tobacco: Never  Substance Use Topics  . Alcohol  use: No  . Drug use: No   Family History  Problem Relation Age of Onset  . Diabetes Father   . Diabetes Paternal Grandmother    Physical Examination: Vitals: BP 115/70   Pulse 138   Resp 16   Ht 147.3 cm (4' 9.99)   Wt 37.9 kg (83 lb 8 oz)   SpO2 94% Comment: while ambulating  BMI 17.46 kg/m  Performance Status: 3 Constitutional: thin Eyes: sclera anicteric, conjunctiva clear, no swelling, EOMI ENT: oropharynx clear, no ulcers noted Cardiovascular: regular rate and rhythm, S1 S2 appreciated, no murmurs or gallops Respiratory:  diminished breath sounds on the  left Gastrointestinal: soft, + nontender abdominal masses Musculoskeletal: no cyanosis, thin skin Skin: clean, dry and intact Neurologic: alert and oriented, generalized weakness in bilateral upper and lower extremities Psychiatric: normal affect Heme/Lymph/Immun: no bruises, no palpable cervical, supraclavicular lymphadenoapathy  Imaging Studies: reviewed personally and per the radiology impression  CT Abdomen Pelvis W Contrast Result Date: 04/17/2024  Impression: 1.  Findings are overall suggestive of disease worsening with interval increase in size and number of many of the previously described hepatic metastases and prominent lymph nodes throughout the upper abdomen and retroperitoneum (as detailed). 2.  Previously described ovarian masses are also increased in size when compared to prior exam, which is suggestive of disease worsening. 3.  Small volume pelvic free fluid, which is nonspecific and may be related to the above findings. 4.  Other chronic or incidental findings, as described within the body of the report.   CT Chest W Contrast Result Date: 04/16/2024  Impression: Compared to 03/17/2024, 1.  Increased size of a large, heterogeneous left upper lobe/hilar mass measuring 11.3 cm with mediastinal, pericardial, and chest wall invasion, detailed in the body the report. 2.  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 3.  Increasing size of several mediastinal, hilar, and right supraclavicular adenopathy. 4.  Marked interval increased size of the right mainstem bronchus endobronchial nodule, presently measuring 1.4 cm. 5.  Please see separately dictated abdominal CT for new and increasing hepatic lesions.   Pertinent Laboratory: Reviewed per EMR  Assessment: Hailey Wise is a pleasant 48 y.o. year old female non-smoker with history of T2DM  metastatic, non-squamous NSCLC s/p  carboplatin/pemetrexed/pembro, docetaxel+ram, gemcitabine here for toxicity check and goals of care discussion. Recent brain MRI also demonstrated new metastases.   We previously discussed that we have not seen response or disease stabilization with any systemic treatment that she has received and now with rapid progression on third line chemotherapy. Molecular testing did not demonstrate actionable genomic alterations and given her inflammatory myositis requiring steroids and IVIG, bulky disease, KEAP1 mutation do not recommend immune checkpoint blockade. Discussed best supportive care with hospice for symptom management and shared my concern for prognosis of weeks to short months. Today we again reviewed what hospice care involves and that this can be performed in the home. Also discussed difference between hospice and palliative care. She has a visit at [facility] next week for second opinion. She would like to  pursue palliative care but not enroll in hospice. Open to an informational visit.   Plan:  # Metastatic NSCLC - adenocarcinoma - stop chemotherapy - has appointment at [facility] for second opinion per patient request - return visit in 2 weeks to continue GOC discussion - will request palliative care visit be rescheduled - informational hospice visit - increase oxycodone  10mg  every 3-4 hours as needed, pending needs can add a long acting agent  # Dehydration/Failure to thrive Signs of dehydration due to inadequate oral intake from early satiety and nausea. 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 Tobie, MD, PhD Division of Oncology [facility]  at [facility]

## 2024-05-10 NOTE — ACP (Advance Care Planning) (Signed)
 ADVANCE CARE PLANNING NOTE  Discussion Date:  May 10, 2024  Patient has decisional capacity:  Yes  Patient has selected a Health Care Decision-Maker if loses capacity: Yes  Health Care Decision Maker as of 05/10/2024   HCDM (patient stated preference): Hailey Wise,Hailey Wise - [phone]  Discussion Participants: [name] Daughter - Donnal Forest  Communication of Medical Status/Prognosis:  Discussed that given progression of cancer and worsening functional status, weight loss do not recommend further cancer directed therapy.   Communication of Treatment Goals/Options:  Reviewed options of palliative care or hospice Discussed code status and that if she had cardiac or respiratory arrest in setting of her advanced cancer likelihood of recovery is low.   Treatment Decisions:  - Would like to seek second opinion at [facility] regarding treatment options - Would like to pursue palliative care, but not hospice yet - Change code status to DNR/DNI     I spent 25 minutes providing voluntary advance care planning services for this patient.

## 2024-05-22 NOTE — Progress Notes (Signed)
 Patient arrived to infusion chair 12 for 1L bolus NS. PIV intact and flushed with + BR. Patient completed and tolerated infusion well. PIV was flushed, +BR, and removed. Patient discharged with no additional needs, AVS declined.

## 2024-05-22 NOTE — Progress Notes (Signed)
 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)  Molecular:Tempus xT Tempus xT/xR (Tumor DNA and RNA sequencing)   Gene Variant Name DNA Change Type Molecular Consequence Variant Classification  KEAP1 p.S102L - c.305C>T Missense variant - LOF   Pathogenic  MTAP MTAP Copy number loss Copy number loss  Pathogenic  TP53 p.Y163C - c.488A>G Missense variant - LOF   Pathogenic  AKT1 p.E17K - c.49G>A Splice region variant - GOF   Pathogenic  NSD1 p.E322* - c.964G>T Stop gain - LOF   Pathogenic  DNMT3A DNMT3A Splice region variant - LOF   Pathogenic  CTNNB1 p.T41I - c.122C>T Missense variant - GOF   Pathogenic  EGFR p.R252H - c.755G>A Missense variant (exon 7) - GOF   Pathogenic  CDKN2A CDKN2A Copy number loss Copy number loss  Pathogenic  CDKN2B CDKN2B Copy number loss Copy number loss  Pathogenic   Oncologic History:  - 10/25/2023 CT Chest w contrast: left upper lobe lung cancer w/multi station mediastinal and hilar LAN, right-sided pulmonary nodules, hypo attenuating liver lesion - 10/27/2023 MRI Brain wwo contrast: no intracranial metastatic disease - 10/29/2023 PET/CT: hypermetabolic disease in bilateral lung, mediastinal, supraclavicular nodes, single hepatic metastasis - 10/31/2023 right supraclavicular lymph node: non-small cell carcinoma, consistent with lung adenocarcinoma (TTF-1+, p40-) - 11/03/2023 CT Abdomen/Pelvis w contrast: 1.2 cm hypodense hepatic lesion - 11/03/2023 carboplatin/pemetrexed - 02/06/2024 docetaxel + ramucirumab - 03/17/24 CT C/A/P shows increase in LUL mass and suprahilar LN, disease progression in hepatic lobe lesions, bilateral ovarian mets and retroperitoneal adenopathy - 03/20/24-03/28/2024 gemcitabine  Visit today conducted with in person Spanish translator History of  Present Illness Keitha Kolk is a 48 year old female with metastatic non-small cell lung cancer who presents for toxicity tracking and goals of care discussion. She is accompanied by her family.  She experiences increased fatigue and weakness, spending most of her time lying down in bed. Swallowing difficulties have worsened, limiting her to one meal a day. Severe pain, particularly in the lower body, persists despite a higher dose of pain medication. She has difficulty drinking and has received IV fluids during visits.  A persistent sensation of mucus in her throat is not alleviated by guaifenesin. She is on a low dose of prednisone for muscle inflammation.  She takes metformin for diabetes, but her blood sugar levels are low due to reduced food intake.   Review of Systems: All systems have been reviewed, pertinent positives and negatives per history of present illness, otherwise deemed unremarkable.  No Known Allergies  Current Outpatient Medications:  .  acetaminophen  (TYLENOL ) 325 MG tablet, Take 2 tablets (650 mg total) by mouth every six (6) hours as needed for pain., Disp: 60 tablet, Rfl: 0 .  alum-mag-simeth (MAALOX PLUS) 200-200-20 mg/5 mL Susp, Mix equal parts diphenhydramine, lidocaine, and liquid antacid. Swish 5 to 10mLs in mouth for 60 seconds, every 4 to 6 hours as needed, then swallow or spit mixture out (as directed by prescriber). Shake well before using. Refrigerate after mixing and discard after 14 days., Disp: 355 mL, Rfl: 0 .  benzonatate (TESSALON) 200 MG capsule, Take 1 capsule (200 mg total) by mouth Three (3) times a day as needed for cough., Disp: 90 capsule, Rfl: 0 .  blood sugar diagnostic (TRUETRACK TEST) Strp, Frequency:QID   Dosage:0.0     Instructions:  Note:Dose:  1, Disp: , Rfl:  .  cetirizine (ZYRTEC) 10 MG tablet, Take 1 tablet (10 mg total) by mouth in the morning., Disp: , Rfl:  .  famotidine (PEPCID) 20 MG tablet, Take 1 tablet (20 mg total) by mouth two (2)  times a day., Disp: 60 tablet, Rfl: 2 .  glucagon  (GLUCAGON , HUMAN RECOMBINANT,) 1 mg Kit, 1 mg. Frequency:PHARMDIR   Dosage:1   MG  Instructions:  Note:To be given by family member in hypoglycemic emergency Dose: 1MG , Disp: , Rfl:  .  glucose 4 GM chewable tablet, Chew 4 tablets (16 g total) every ten (10) minutes as needed for low blood sugar ((For Blood Glucose LESS than 70 mg/dL and GREATER than or EQUAL to 54 mg/dL and able to take by mouth))., Disp: 50 tablet, Rfl: 12 .  hydrocortisone 2.5 % cream, , Disp: , Rfl:  .  insulin  NPH isoph U-100 human (HUMULIN) 100 unit/mL (3 mL) injection pen, Inject 20 Units under the skin daily AND 8 Units nightly., Disp: 15 mL, Rfl: 0 .  OLANZapine (ZYPREXA) 2.5 MG tablet, TAKE 1 TABLET (2.5 MG TOTAL) BY MOUTH AT BEDTIME FOR 60 DAYS, Disp: , Rfl:  .  osimertinib (TAGRISSO) 80 mg tablet, Take 1 tablet (80 mg total) by mouth., Disp: , Rfl:  .  pen needle, diabetic 32 gauge x 5/32 (4 mm) Ndle, Use with insulin  up to 4 times/day as needed., Disp: 100 each, Rfl: 0 .  predniSONE (DELTASONE) 10 MG tablet, Take 1 tablet (10 mg total) by mouth daily., Disp: 30 tablet, Rfl: 0 .  prochlorperazine (COMPAZINE) 10 MG tablet, Take 1 tablet (10 mg total) by mouth every six (6) hours as needed (Nausea/Vomiting)., Disp: 30 tablet, Rfl: 2 .  TRUEPLUS INSULIN  0.5 mL 30 gauge x 5/16 (8 mm) Syrg, , Disp: , Rfl:  .  oxyCODONE  (ROXICODONE ) 10 mg immediate release tablet, Take 1 tablet (10 mg total) by mouth every four (4) hours as needed for pain., Disp: 90 tablet, Rfl: 0 .  predniSONE (DELTASONE) 20 MG tablet, Take 2 tablets (40 mg total) by mouth daily for 7 days, THEN 1 tablet (20 mg total) daily for 7 days., Disp: 21 tablet, Rfl: 0 .  pregabalin (LYRICA) 25 MG capsule, Take 1 capsule (25 mg total) by mouth Three (3) times a day., Disp: 90 capsule, Rfl: 2  Past Medical History:  Diagnosis Date  . Diabetes mellitus (CMS-HCC)   . Diabetes mellitus type 2 in nonobese (CMS-HCC)     Social History   Tobacco Use  . Smoking status: Never  . Smokeless tobacco: Never  Substance Use Topics  . Alcohol  use: No  . Drug use: No   Family History  Problem Relation Age of Onset  . Diabetes Father   . Diabetes Paternal Grandmother    Physical Examination: Vitals: BP 106/74   Pulse 123   Temp 37.8 C (100 F) (Temporal)   Resp 16   Ht 144.8 cm (4' 9)   Wt 37.7 kg (83 lb 1.6 oz)   SpO2 95%   BMI 17.98 kg/m  Performance Status: 3 Constitutional: thin Eyes: sclera anicteric, conjunctiva clear, no swelling, EOMI ENT: oropharynx clear, no ulcers noted Cardiovascular: regular rate and rhythm, S1 S2 appreciated, no murmurs or gallops Respiratory:  diminished breath sounds on the left Gastrointestinal: soft, + nontender, firm abdominal masses Musculoskeletal: no cyanosis, thin skin Skin: clean, dry and intact Neurologic: alert and oriented, generalized weakness in bilateral upper and lower extremities Psychiatric: normal affect  Heme/Lymph/Immun: no bruises, no palpable cervical, supraclavicular lymphadenoapathy  Imaging Studies: no new imaging for review  Pertinent Laboratory: Reviewed per EMR  Assessment: Ms. Harwood is a pleasant 48 y.o. year old female non-smoker with history of T2DM  metastatic, non-squamous NSCLC s/p carboplatin/pemetrexed/pembro, docetaxel+ram, gemcitabine here for toxicity check and goals of care discussion. Recent brain MRI also demonstrated new metastases.   We previously discussed that we have not seen response or disease stabilization with any systemic treatment that she has received and now with rapid progression on third line chemotherapy. Molecular testing did not demonstrate actionable genomic alterations and given her inflammatory myositis requiring steroids and IVIG, bulky disease, KEAP1 mutation do not recommend immune checkpoint blockade. Discussed best supportive care with hospice for symptom management.   In interim, had visit  with [facility] medical oncology who recommended osimertinib. 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 If able to get authorization, she would like proceed with trial of the therapy that has been recommended.  Plan:  # Metastatic NSCLC - adenocarcinoma - will reach out to [facility] to coordinate care - would benefit from palliative care consultation but awaiting decision regarding future treatment and possible transfer of care. - oxycodone  10mg  every 3-4 hours as needed, pending needs can add a long acting agent  # Cough/congestion - continue guaifenesin + tessalon - trial course of higher dose prednisone for possible symptomatic benefit, taper prescribed  # Dehydration/Failure to thrive - Administer IV fluids today  # Inflammatory myositis - resume prednisone 10mg  daily after taper above - IVIG scheduled every 4 weeks per rheumatology - continue pregabalin 25 mg TID  # Goals of care  - DNR/DNI discussed at last visit - goal is to continue cancer directed therapy, awaiting additional information from [facility] consult  Shetal A. Tobie, MD, PhD Division of Oncology [employer]  at [facility]

## 2024-06-05 NOTE — Progress Notes (Signed)
 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)  Molecular:Tempus xT Tempus xT/xR (Tumor DNA and RNA sequencing)   Gene Variant Name DNA Change Type Molecular Consequence Variant Classification  KEAP1 p.S102L - c.305C>T Missense variant - LOF   Pathogenic  MTAP MTAP Copy number loss Copy number loss  Pathogenic  TP53 p.Y163C - c.488A>G Missense variant - LOF   Pathogenic  AKT1 p.E17K - c.49G>A Splice region variant - GOF   Pathogenic  NSD1 p.E322* - c.964G>T Stop gain - LOF   Pathogenic  DNMT3A DNMT3A Splice region variant - LOF   Pathogenic  CTNNB1 p.T41I - c.122C>T Missense variant - GOF   Pathogenic  EGFR p.R252H - c.755G>A Missense variant (exon 7) - GOF   Pathogenic  CDKN2A CDKN2A Copy number loss Copy number loss  Pathogenic  CDKN2B CDKN2B Copy number loss Copy number loss  Pathogenic   Oncologic History:  - 10/25/2023 CT Chest w contrast: left upper lobe lung cancer w/multi station mediastinal and hilar LAN, right-sided pulmonary nodules, hypo attenuating liver lesion - 10/27/2023 MRI Brain wwo contrast: no intracranial metastatic disease - 10/29/2023 PET/CT: hypermetabolic disease in bilateral lung, mediastinal, supraclavicular nodes, single hepatic metastasis - 10/31/2023 right supraclavicular lymph node: non-small cell carcinoma, consistent with lung adenocarcinoma (TTF-1+, p40-) - 11/03/2023 CT Abdomen/Pelvis w contrast: 1.2 cm hypodense hepatic lesion - 11/03/2023 carboplatin/pemetrexed - 02/06/2024 docetaxel + ramucirumab - 03/17/24 CT C/A/P shows increase in LUL mass and suprahilar LN, disease progression in hepatic lobe lesions, bilateral ovarian mets and retroperitoneal adenopathy - 03/20/24-03/28/2024 gemcitabine  Visit today conducted with Spanish translator by phone  Interval  History - patient unable to connect to video visit - spoke to patient and cousin by phone w/assistance of interpretor - she has been increasingly weak - no largely in bed in past few days - not eating very much, had some broth today - has been using more of the pain medication - they were able to fill last week - they have not had any further communication with [facility] team   Review of Systems: All systems have been reviewed, pertinent positives and negatives per history of present illness, otherwise deemed unremarkable.  No Known Allergies  Current Outpatient Medications:  .  acetaminophen  (TYLENOL ) 325 MG tablet, Take 2 tablets (650 mg total) by mouth every six (6) hours as needed for pain., Disp: 60 tablet, Rfl: 0 .  alum-mag-simeth (MAALOX PLUS) 200-200-20 mg/5 mL Susp, Mix equal parts diphenhydramine, lidocaine, and liquid antacid. Swish 5 to 10mLs in mouth for 60 seconds, every 4 to 6 hours as needed, then swallow or spit mixture out (as directed by prescriber). Shake well before using. Refrigerate after mixing and discard after 14 days., Disp: 355 mL, Rfl: 0 .  benzonatate (TESSALON) 200 MG capsule, Take 1 capsule (200 mg total) by mouth Three (3) times a day as needed for cough., Disp: 90 capsule, Rfl: 0 .  blood sugar diagnostic (TRUETRACK TEST) Strp, Frequency:QID   Dosage:0.0     Instructions:  Note:Dose: 1, Disp: , Rfl:  .  cetirizine (ZYRTEC) 10 MG tablet, Take 1 tablet (10 mg total) by mouth in the morning., Disp: , Rfl:  .  codeine-guaiFENesin (GUAIFENESIN AC) 10-100 mg/5 mL liquid, Take 5 mL by mouth Three (3) times a day as needed for cough., Disp: 473 mL, Rfl: 0 .  famotidine (PEPCID) 20 MG tablet, Take 1 tablet (20 mg total) by mouth two (2) times a day., Disp: 60 tablet, Rfl: 2 .  glucagon  (GLUCAGON , HUMAN RECOMBINANT,) 1 mg Kit, 1 mg. Frequency:PHARMDIR   Dosage:1   MG  Instructions:  Note:To be given by family member in hypoglycemic emergency Dose: 1MG , Disp: , Rfl:  .   glucose 4 GM chewable tablet, Chew 4 tablets (16 g total) every ten (10) minutes as needed for low blood sugar ((For Blood Glucose LESS than 70 mg/dL and GREATER than or EQUAL to 54 mg/dL and able to take by mouth))., Disp: 50 tablet, Rfl: 12 .  guaiFENesin (MUCINEX) 600 mg 12 hr tablet, Take 1 tablet (600 mg total) by mouth two (2) times a day., Disp: 60 tablet, Rfl: 2 .  hydrocortisone 2.5 % cream, , Disp: , Rfl:  .  insulin  NPH isoph U-100 human (HUMULIN) 100 unit/mL (3 mL) injection pen, Inject 20 Units under the skin daily AND 8 Units nightly., Disp: 15 mL, Rfl: 0 .  naloxone (NARCAN) 4 mg nasal spray, 1 spray (4 mg total) into alternating nostrils once as needed for up to 1 dose. One spray in either nostril once for known/suspected opioid overdose. May repeat every 2-3 minutes in alternating nostril til EMS arrives, Disp: 2 each, Rfl: 0 .  OLANZapine (ZYPREXA) 2.5 MG tablet, TAKE 1 TABLET (2.5 MG TOTAL) BY MOUTH AT BEDTIME FOR 60 DAYS, Disp: , Rfl:  .  osimertinib (TAGRISSO) 80 mg tablet, Take 1 tablet (80 mg total) by mouth., Disp: , Rfl:  .  oxyCODONE  (ROXICODONE ) 10 mg immediate release tablet, Take 1 tablet (10 mg total) by mouth every four (4) hours as needed for pain., Disp: 90 tablet, Rfl: 0 .  pen needle, diabetic 32 gauge x 5/32 (4 mm) Ndle, Use with insulin  up to 4 times/day as needed., Disp: 100 each, Rfl: 0 .  predniSONE (DELTASONE) 10 MG tablet, Take 1 tablet (10 mg total) by mouth daily., Disp: 30 tablet, Rfl: 0 .  pregabalin (LYRICA) 25 MG capsule, Take 1 capsule (25 mg total) by mouth Three (3) times a day., Disp: 90 capsule, Rfl: 2 .  prochlorperazine (COMPAZINE) 10 MG tablet, Take 1 tablet (10 mg total) by mouth every six (6) hours as needed (Nausea/Vomiting)., Disp: 30 tablet, Rfl: 2 .  TRUEPLUS INSULIN  0.5 mL 30 gauge x 5/16 (8 mm) Syrg, , Disp: , Rfl:   Past Medical History:  Diagnosis Date  . Diabetes mellitus (CMS-HCC)   . Diabetes mellitus type 2 in nonobese (CMS-HCC)     Social History   Tobacco Use  . Smoking status: Never  . Smokeless tobacco: Never  Substance Use Topics  . Alcohol  use: No  . Drug use: No   Family History  Problem Relation Age of Onset  . Diabetes Father   . Diabetes Paternal Grandmother    Physical Examination: No exam, phone visit ECOG PS 4 Voice strained, weak  Imaging Studies: no new imaging for review  Pertinent Laboratory: Reviewed per EMR  Assessment: Hailey Wise is a pleasant 48 y.o. year old female non-smoker with history of T2DM  metastatic, non-squamous NSCLC s/p carboplatin/pemetrexed/pembro, docetaxel+ram, gemcitabine here for toxicity check and goals of care discussion. Recent brain MRI also demonstrated new metastases.   We previously discussed that we have not seen response or disease stabilization with any systemic treatment that she has received and now with rapid progression on third line chemotherapy. Molecular testing did not demonstrate actionable  genomic alterations and given her inflammatory myositis requiring steroids and IVIG, bulky disease, KEAP1 mutation do not recommend immune checkpoint blockade.   Today, I again recommended enrollment in hospice care. She and her cousin who was present on the phone call would like to discuss with family but did ask me to call her husband to discuss this as well. I emphasized that this could be home hospice but could provide additional support for symptom management, hospital bed etc. Discussed that parenteral nutrition in the setting of advanced malignancy without treatment options is not beneficial and continuing to do what she can/feels comfortable with by mouth is best.  Plan:  # Metastatic NSCLC - adenocarcinoma - recommend hospice care given progressive malignancy without further treatment options and worsening PS - she asked that I call her husband, wished to discuss further with family before making a decision - oxycodone  10mg  every 3-4 hours as needed,  will place future script  # Cough/congestion - continue guaifenesin/codeine + tessalon  # Inflammatory myositis - prednisone 10mg  daily - continue pregabalin 25 mg TID  # Goals of care  - DNR/DNI  - ongoing discussion regarding home hospice  Shetal A. Tobie, MD, PhD Division of Oncology [employer]  at [facility]   The patient reports they are physically located in [place]  and is currently: at home. I conducted a phone visit.  I spent 25 minutes on the phone call with the patient on the date of service .

## 2024-06-06 NOTE — Telephone Encounter (Signed)
 Per patient request called her husband Mr. Gailen via interpretor services to discuss next steps Spoke to him re: my recommendation for hospice care given worsening pain/functional status. He said he had not discussed with his wife yet but at this time did not want anyone coming to the home. When I asked re: when they would like us  to follow-up he said they would not want another call but will reach out to let us  know. Also tried calling son, [name] Gailen who was present at last clinic visit and listed as HCDM but no response and unable to call back. Will plan to try and reach back out early next week.

## 2024-06-09 NOTE — Progress Notes (Addendum)
 [facility] Navigation Program Outreach  OPN received an in-basket message from Dr. Excell Blanch, who informed that she has communicated with the patients husband regarding the patients care. Hospice care may be needed, and there is concern about limited time.  OPN attempted to follow up with the patient's family to assess her status. Two attempts were made, but contact was unsuccessful, and no message could be left.  OPN will attempt contact again later.  Case previously resolved. PRN call.  Additional follow-up call will occur in the coming weeks.

## 2024-06-09 NOTE — Telephone Encounter (Signed)
 Attempted to return call. No answer.Left V/M that I will try again soon.

## 2024-06-09 NOTE — Telephone Encounter (Signed)
 Attempted to return call.No answer. Left V/M to call back with any further needs.

## 2024-06-09 NOTE — Telephone Encounter (Signed)
 Copied from CRM #[id number]. Topic: Care Management - Discuss Care Plan >> Jun 09, 2024  3:31 PM Reyes RAMAN wrote:   [place]   [name] Forest contacted the Communication Center requesting to speak with the care team of [facility] to discuss:  Requesting emergency oxygen tanks for pt  Please contact Mr. Forest  at [phone].  Thank you,  Reyes DELENA Acre [facility]  [phone]

## 2024-06-10 NOTE — Telephone Encounter (Signed)
 Attempted to return call. Left V/M to call back with any further needs.

## 2024-06-16 NOTE — Telephone Encounter (Signed)
 Called patient's son [name] Forest He had called requesting oxygen We discussed that this needs formal documentation but also re-addressed question of hospice, which could provide oxygen, hospital bed and pain medication.  He was open to referral.  Will ask team to assist tomorrow AM  He said she had enough pain medication

## 2024-06-17 ENCOUNTER — Emergency Department: Payer: Self-pay

## 2024-06-17 ENCOUNTER — Other Ambulatory Visit: Payer: Self-pay

## 2024-06-17 ENCOUNTER — Emergency Department
Admission: EM | Admit: 2024-06-17 | Discharge: 2024-06-17 | Disposition: A | Discharge status: Home / Self Care | Payer: Self-pay | Attending: Emergency Medicine | Admitting: Emergency Medicine

## 2024-06-17 DIAGNOSIS — I2699 Other pulmonary embolism without acute cor pulmonale: Secondary | ICD-10-CM | POA: Insufficient documentation

## 2024-06-17 DIAGNOSIS — R000 Tachycardia, unspecified: Secondary | ICD-10-CM | POA: Insufficient documentation

## 2024-06-17 DIAGNOSIS — Z85118 Personal history of other malignant neoplasm of bronchus and lung: Secondary | ICD-10-CM | POA: Insufficient documentation

## 2024-06-17 DIAGNOSIS — E860 Dehydration: Secondary | ICD-10-CM | POA: Insufficient documentation

## 2024-06-17 DIAGNOSIS — M79604 Pain in right leg: Secondary | ICD-10-CM | POA: Insufficient documentation

## 2024-06-17 DIAGNOSIS — R0600 Dyspnea, unspecified: Secondary | ICD-10-CM

## 2024-06-17 LAB — CBC WITH DIFFERENTIAL/PLATELET
Abs Immature Granulocytes: 0.12 K/uL — ABNORMAL HIGH (ref 0.00–0.07)
Basophils Absolute: 0 K/uL (ref 0.0–0.1)
Basophils Relative: 0 %
Eosinophils Absolute: 0 K/uL (ref 0.0–0.5)
Eosinophils Relative: 0 %
HCT: 31 % — ABNORMAL LOW (ref 36.0–46.0)
Hemoglobin: 9.6 g/dL — ABNORMAL LOW (ref 12.0–15.0)
Immature Granulocytes: 1 %
Lymphocytes Relative: 14 %
Lymphs Abs: 1.2 K/uL (ref 0.7–4.0)
MCH: 29.6 pg (ref 26.0–34.0)
MCHC: 31 g/dL (ref 30.0–36.0)
MCV: 95.7 fL (ref 80.0–100.0)
Monocytes Absolute: 0.6 K/uL (ref 0.1–1.0)
Monocytes Relative: 7 %
Neutro Abs: 6.6 K/uL (ref 1.7–7.7)
Neutrophils Relative %: 78 %
Platelets: 370 K/uL (ref 150–400)
RBC: 3.24 MIL/uL — ABNORMAL LOW (ref 3.87–5.11)
RDW: 19.9 % — ABNORMAL HIGH (ref 11.5–15.5)
WBC: 8.5 K/uL (ref 4.0–10.5)
nRBC: 0.7 % — ABNORMAL HIGH (ref 0.0–0.2)

## 2024-06-17 LAB — MAGNESIUM: Magnesium: 2 mg/dL (ref 1.7–2.4)

## 2024-06-17 LAB — COMPREHENSIVE METABOLIC PANEL WITH GFR
ALT: 12 U/L (ref 0–44)
AST: 163 U/L — ABNORMAL HIGH (ref 15–41)
Albumin: 2.4 g/dL — ABNORMAL LOW (ref 3.5–5.0)
Alkaline Phosphatase: 359 U/L — ABNORMAL HIGH (ref 38–126)
Anion gap: 16 — ABNORMAL HIGH (ref 5–15)
BUN: 12 mg/dL (ref 6–20)
CO2: 26 mmol/L (ref 22–32)
Calcium: 9.9 mg/dL (ref 8.9–10.3)
Chloride: 97 mmol/L — ABNORMAL LOW (ref 98–111)
Creatinine, Ser: <0.3 mg/dL — ABNORMAL LOW (ref 0.44–1.00)
Glucose, Bld: 112 mg/dL — ABNORMAL HIGH (ref 70–99)
Potassium: 3.1 mmol/L — ABNORMAL LOW (ref 3.5–5.1)
Sodium: 139 mmol/L (ref 135–145)
Total Bilirubin: 0.5 mg/dL (ref 0.0–1.2)
Total Protein: 7 g/dL (ref 6.5–8.1)

## 2024-06-17 LAB — TROPONIN T, HIGH SENSITIVITY: Troponin T High Sensitivity: 39 ng/L — ABNORMAL HIGH (ref 0–19)

## 2024-06-17 MED ORDER — HALOPERIDOL LACTATE 5 MG/ML IJ SOLN: 0.5000 mg | INTRAMUSCULAR | Status: DC | PRN

## 2024-06-17 MED ORDER — MORPHINE SULFATE (PF) 2 MG/ML IV SOLN
2.0000 mg | Freq: Once | INTRAVENOUS | Status: AC
  Administered 2024-06-17: 2 mg via INTRAVENOUS
  Filled 2024-06-17: qty 1

## 2024-06-17 MED ORDER — BIOTENE DRY MOUTH MT LIQD: 15.0000 mL | OROMUCOSAL | Status: DC | PRN

## 2024-06-17 MED ORDER — MORPHINE SULFATE (PF) 4 MG/ML IV SOLN: 4.0000 mg | Freq: Once | INTRAVENOUS | Status: DC

## 2024-06-17 MED ORDER — ONDANSETRON HCL 4 MG/2ML IJ SOLN: 4.0000 mg | Freq: Four times a day (QID) | INTRAMUSCULAR | Status: DC | PRN

## 2024-06-17 MED ORDER — ONDANSETRON 4 MG PO TBDP
4.0000 mg | ORAL_TABLET | Freq: Four times a day (QID) | ORAL | Status: DC | PRN
  Administered 2024-06-17: 4 mg via ORAL
  Filled 2024-06-17: qty 1

## 2024-06-17 MED ORDER — ACETAMINOPHEN 325 MG RE SUPP: 650.0000 mg | Freq: Four times a day (QID) | RECTAL | Status: DC | PRN

## 2024-06-17 MED ORDER — OXYCODONE HCL 5 MG PO TABS
10.0000 mg | ORAL_TABLET | ORAL | Status: DC | PRN
  Administered 2024-06-17 (×2): 10 mg via ORAL
  Filled 2024-06-17 (×2): qty 2

## 2024-06-17 MED ORDER — HEPARIN (PORCINE) 25000 UT/250ML-% IV SOLN: 750.0000 [IU]/h | INTRAVENOUS | Status: DC

## 2024-06-17 MED ORDER — MORPHINE SULFATE (CONCENTRATE) 10 MG /0.5 ML PO SOLN: 10.0000 mg | Freq: Four times a day (QID) | ORAL | Status: DC | PRN

## 2024-06-17 MED ORDER — GLYCOPYRROLATE 0.2 MG/ML IJ SOLN
0.2000 mg | INTRAMUSCULAR | Status: DC | PRN
Start: 2024-06-17 — End: 2024-06-18

## 2024-06-17 MED ORDER — ACETAMINOPHEN 325 MG PO TABS: 650.0000 mg | ORAL_TABLET | Freq: Four times a day (QID) | ORAL | Status: DC | PRN

## 2024-06-17 MED ORDER — GLYCOPYRROLATE 1 MG PO TABS: 1.0000 mg | ORAL_TABLET | ORAL | Status: DC | PRN

## 2024-06-17 MED ORDER — ONDANSETRON HCL 4 MG/2ML IJ SOLN
4.0000 mg | Freq: Once | INTRAMUSCULAR | Status: AC
  Administered 2024-06-17: 4 mg via INTRAVENOUS
  Filled 2024-06-17: qty 2

## 2024-06-17 MED ORDER — HALOPERIDOL 0.5 MG PO TABS: 0.5000 mg | ORAL_TABLET | ORAL | Status: DC | PRN

## 2024-06-17 MED ORDER — POLYVINYL ALCOHOL 1.4 % OP SOLN: 1.0000 [drp] | Freq: Four times a day (QID) | OPHTHALMIC | Status: DC | PRN

## 2024-06-17 MED ORDER — HEPARIN BOLUS VIA INFUSION
3000.0000 [IU] | Freq: Once | INTRAVENOUS | Status: DC
  Filled 2024-06-17: qty 3000

## 2024-06-17 MED ORDER — IOHEXOL 350 MG/ML SOLN
45.0000 mL | Freq: Once | INTRAVENOUS | Status: AC | PRN
  Administered 2024-06-17: 45 mL via INTRAVENOUS

## 2024-06-17 MED ORDER — LACTATED RINGERS IV BOLUS
1000.0000 mL | Freq: Once | INTRAVENOUS | Status: AC
  Administered 2024-06-17: 1000 mL via INTRAVENOUS

## 2024-06-17 MED ORDER — HALOPERIDOL LACTATE 2 MG/ML PO CONC: 0.5000 mg | ORAL | Status: DC | PRN

## 2024-06-17 MED ORDER — GLYCOPYRROLATE 0.2 MG/ML IJ SOLN: 0.2000 mg | INTRAMUSCULAR | Status: DC | PRN

## 2024-06-17 NOTE — TOC Initial Note (Signed)
 Transition of Care [facility]) - Initial/Assessment Note    Patient Details  Name: Hailey Wise MRN: [id number] Date of Birth: May 17, 1976  Transition of Care [facility]) CM/SW Contact:    Hailey CHRISTELLA Ring, RN Phone Number: 06/17/2024, 11:47 AM  Clinical Narrative:                 CM met with patient and her husband and her son at the bedside in the ED.  Video Remote Interpreter used Spanish speaking patient and family.  She met with Palliative Care and has decided that she wants to go home with hospice.  Choice offered they choose [facility].  She does not have insurance and [employer] offers [employer]  Hailey Wise notified of home with hospice referral and that she will need oxygen and a hospital bed.  He will go and meet with patient and family at the bedside.  Hopefully she can be discharged home today.  Patient and family have no questions at this time for TOC.    Expected Discharge Plan: Home w Hospice Care Barriers to Discharge: Other (must enter comment) (Setting up Hospice services)   Patient Goals and CMS Choice Patient states their goals for this hospitalization and ongoing recovery are:: To go home with hospice CMS Medicare.gov Compare Post Acute Care list provided to:: Patient Choice offered to / list presented to : Patient      Expected Discharge Plan and Services In-house Referral: Hospice / Palliative Care, Interpreting Services Discharge Planning Services: CM Consult Post Acute Care Choice: Hospice Living arrangements for the past 2 months: Single Family Home                 DME Arranged: Hospital bed, Oxygen DME Agency: Other - Comment [facility]) Date DME Agency Contacted: 06/17/24 Time DME Agency Contacted: 1145 Representative spoke with at DME Agency: Hailey            Prior Living Arrangements/Services Living arrangements for the past 2 months: Single Family Home Lives with:: Adult Children, Minor Children, Spouse Patient  language and need for interpreter reviewed:: Yes (Spanish speaking- Video Remote interpreter utilized) Do you feel safe going back to the place where you live?: Yes      Need for Family Participation in Patient Care: Yes (Comment) Care giver support system in place?: Yes (comment)   Criminal Activity/Legal Involvement Pertinent to Current Situation/Hospitalization: No - Comment as needed  Activities of Daily Living      Permission Sought/Granted Permission sought to share information with : Case Manager, Family Supports, Other (comment) Permission granted to share information with : Yes, Verbal Permission Granted     Permission granted to share info w AGENCY: [facility]        Emotional Assessment Appearance:: Appears older than stated age Attitude/Demeanor/Rapport: Engaged Affect (typically observed): Accepting Orientation: : Oriented to Self, Oriented to Place, Oriented to  Time, Oriented to Situation Alcohol  / Substance Use: Not Applicable Psych Involvement: No (comment)  Admission diagnosis:  Pain Patient Active Problem List   Diagnosis Date Noted   Advanced maternal age in multigravida, first trimester 01/22/2017   Diabetes mellitus in pregnancy 01/22/2017   Obesity (BMI 30-39.9) 01/22/2017   PCP:  [facility] Pharm[facility]:   [facility] - [place], [place] - [address] [place] [address] Phone: [phone] Fax: [phone]     Social Drivers of Health (SDOH) Social History:  SDOH Screenings   Food Insecurity: No Food Insecurity (02/15/2024)   Received from [facility]  Housing: Low Risk  (10/18/2023)   Received from [facility]  Transportation Needs: No Transportation Needs (02/15/2024)   Received from [facility]  Utilities: Low Risk (02/15/2024)   Received from [facility]  Financial Resource Strain: Low Risk (02/15/2024)   Received from [facility]   Tobacco Use: Low Risk  (06/17/2024)  Health Literacy: Low Risk (02/15/2024)   Received from [facility]   SDOH Interventions:     Readmission Risk Interventions     No data to display

## 2024-06-17 NOTE — ED Triage Notes (Signed)
 Pt to ED [facility] from home for pain all over. Scheduled to start hospice this week for lung cancer with mets. Wears 2 L [place] chronic. Reports difficulty talking d/t pain. Pt asking for morphine  Not currently on chemo

## 2024-06-17 NOTE — ED Notes (Addendum)
 Pt and son at bedside informed this RN that they would like to opt out of [facility] transport and would like to leave POV. This RN asked pt and son if they had oxygen to use during transport and they said no, but that they would still like to leave as oxygen is available at their home. ED secretary informed to cancel [facility] transport.

## 2024-06-17 NOTE — Consult Note (Signed)
 Consultation Note Date: 06/17/2024 at 1000  Patient Name: Hailey Wise  DOB: 02-26-1976  MRN: [id number]  Age / Sex: 48 y.o., female  PCP: [facility] Referring Physician: Suzanne Kirsch, MD  HPI/Patient Profile: 48 y.o. female  with past medical history of metastatic NSCLC (followed at [facility] with second opinion obtained from [facility]) admitted on 06/17/2024 with SOB, pain in her chest, neck, abdomen.   As per chart review, Pt had visit with [facility] medical oncology who recommended osimertinib. 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 If able to get authorization, she would like proceed with trial of the therapy that has been recommended.   Clinical Assessment and Goals of Care: Extensive chart review completed prior to meeting patient including labs, vital signs, imaging, progress notes, orders, and available advanced directive documents from current and previous encounters. I then met with patient, her son, and her husband at bedside in ED to discuss diagnosis prognosis, GOC, EOL wishes, disposition and options utilizing Spanish interpreter services [phone])  I introduced Palliative Medicine as specialized medical care for people living with serious illness. It focuses on providing relief from the symptoms and stress of a serious illness. The goal is to improve quality of life for both the patient and the family.  We discussed a brief life review of the patient.  Patient is married and has 4 children who live nearby.  Additionally, her parents are still alive and help care for her as well.  We discussed patient's current illness and what it means in the larger context of patient's on-going co-morbidities.  Natural disease trajectory and  expectations at EOL were discussed.  I attempted to elicit values and goals of care important to the patient.  Patient's husband and son both share they would like to go home with hospice services.  They shared that patient was to be enrolled in hospice services today but that she had to live return to the hospital given her increased pain.  They are asking if they have been established with hospice already.  Hospice services, aging in place, symptom management discussed in detail.  Family shares understanding that when hospice services are enacted, no further aggressive treatment is considered.  Patient and family at bedside endorsed understanding and wish to proceed with hospice services at home.  Symptoms assessed.  Patient endorses she feels well and has no shortness of breath at this time.  She also shares she is able to swallow pills without difficulty.  Discussed medication regimen to include current home medications: (prednisone, guaifenesin with codeine, continue pregabalin, and oxycodone  IR) as well as low dose oral morphine  for increased WOB and air hunger.  Family endorsed appreciation and understanding that current medications will be continued that are focused on comfort and relief of suffering from symptoms of disease process.  After visiting with the patient, I counseled with attending Dr.Mumma,  RN [place] , and TOCJeanna.  TOC to offer choice of hospice services.  Plan is for patient to discharge from ED to home with hospice services.  Adjustments made to [facility].  PMT will continue to follow and support.  Primary Decision Maker PATIENT  Physical Exam Vitals reviewed.  Constitutional:      Comments: Thin, frail  HENT:     Head:     Comments: Temporal wasting    Mouth/Throat:     Mouth: Mucous membranes are moist.  Eyes:     Pupils: Pupils are equal, round, and reactive to light.  Cardiovascular:     Rate and Rhythm: Normal rate.  Pulmonary:     Effort: Pulmonary effort is  normal.     Comments: 2L  Abdominal:     Palpations: Abdomen is soft.  Skin:    General: Skin is warm and dry.  Neurological:     Mental Status: She is alert and oriented to person, place, and time.  Psychiatric:        Mood and Affect: Mood normal.        Behavior: Behavior normal.        Thought Content: Thought content normal.     Palliative Assessment/Data: 30%     Thank you for this consult. Palliative medicine will continue to follow and assist holistically.   75 minute visit includes: Detailed review of medical records (labs, imaging, vital signs), medically appropriate exam (mental status, respiratory, cardiac, skin), discussed with treatment team, counseling and educating patient, family and staff, documenting clinical information, medication management and coordination of care.  Signed by: Lamarr Gunner, DNP, FNP-BC Palliative Medicine   Please contact Palliative Medicine Team providers via [facility] for questions and concerns.

## 2024-06-17 NOTE — ED Provider Notes (Addendum)
 [facility] Provider Note    Event Date/Time   First MD Initiated Contact with Patient 06/17/24 [phone]     (approximate)   History   No chief complaint on file.  Formal translating service utilized HPI  Hailey Wise is a 48 y.o. 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  Patient states that she has been feeling more short of breath with pain in her chest, her neck and her abdomen.  Also having some pain to her right leg.  Denies any falls or trauma.  Decreased p.o. intake and only take small sips of water in a day with decreased urine output.  States that she would want antibiotics if found to have a pneumonia.  Would want anticoagulation if found to have a blood clot.  But states that their main goal is to keep comfortable towards her end of life.  States that they are not currently undergoing any chemotherapy.  They had plans to establish with hospice today.  In chart review is in a telephone encounter patient's son [name] had been calling to request oxygen.  Discussed that they needed a referral for hospice and they were going to be assessed today.  Stated at that time they had enough pain medication but needed oxygen, hospital bed.      Physical Exam   Triage Vital Signs: ED Triage Vitals  Encounter Vitals Group     BP 06/17/24 0642 102/65     Girls Systolic BP Percentile --      Girls Diastolic BP Percentile --      Boys Systolic BP Percentile --      Boys Diastolic BP Percentile --      Pulse Rate 06/17/24 0642 (!) 124     Resp 06/17/24 0642 (!) 28     Temp 06/17/24 0642 98.6 F (37 C)     Temp src --      SpO2 06/17/24 0642 100 %     Weight 06/17/24 0639 95 lb (43.1 kg)     Height --      Head Circumference --      Peak Flow --      Pain Score 06/17/24 0655 10     Pain Loc --      Pain Education --      Exclude from Growth Chart --      Most recent vital signs: Vitals:   06/17/24 1000 06/17/24 1030  BP: 103/72 105/71  Pulse: (!) 118 (!) 117  Resp: (!) 23 (!) 22  Temp:    SpO2: 100% 100%    Physical Exam Constitutional:      Appearance: She is well-developed. She is ill-appearing.     Comments: Thin with temporal wasting, appears cachectic  HENT:     Head: Atraumatic.     Mouth/Throat:     Mouth: Mucous membranes are dry.  Eyes:     Conjunctiva/sclera: Conjunctivae normal.  Cardiovascular:     Rate and Rhythm: Regular rhythm. Tachycardia present.     Pulses: Normal pulses.  Pulmonary:     Effort: No respiratory distress.  Abdominal:     General: There is no distension.     Tenderness: There is abdominal tenderness.  Musculoskeletal:        General: Normal range of motion.     Cervical back: Normal range of motion.     Right lower leg: No edema.  Left lower leg: No edema.  Skin:    General: Skin is warm.     Capillary Refill: Capillary refill takes less than 2 seconds.  Neurological:     Mental Status: She is alert. Mental status is at baseline.     IMPRESSION / MDM / ASSESSMENT AND PLAN / ED COURSE  I reviewed the triage vital signs and the nursing notes.  Differential diagnosis including pneumonia, dehydration, electrolyte abnormality, progression of malignancy, pulmonary embolism  On arrival afebrile, tachycardic and tachypneic requiring 2 L nasal cannula to 100%  Plans for consultation with hospice team however after discussion with family members and patient stated that she would want treatment for pneumonia or for a blood clot so we will workup pneumonia, blood clot and give IV fluids and lab work.  Given 2 mg of morphine   EKG  I, Clotilda Punter, the attending physician, personally viewed and interpreted this ECG.  EKG with sinus tachycardia with a heart rate of 122.  Narrow complex.  Normal intervals.  Nonspecific ST changes with ST depression.  No significant ST elevation.  Sinus  tachycardia while on cardiac telemetry.  RADIOLOGY I independently reviewed imaging, my interpretation of imaging: CTA PE -significant disease burden with diffuse malignancies/metastasis.  Discussed result with radiologist and small tiny filling defect with subsegmental acute PE  LABS (all labs ordered are listed, but only abnormal results are displayed) Labs interpreted as -    Labs Reviewed  CBC WITH DIFFERENTIAL/PLATELET - Abnormal; Notable for the following components:      Result Value   RBC 3.24 (*)    Hemoglobin 9.6 (*)    HCT 31.0 (*)    RDW 19.9 (*)    nRBC 0.7 (*)    Abs Immature Granulocytes 0.12 (*)    All other components within normal limits  COMPREHENSIVE METABOLIC PANEL WITH GFR - Abnormal; Notable for the following components:   Potassium 3.1 (*)    Chloride 97 (*)    Glucose, Bld 112 (*)    Creatinine, Ser <0.30 (*)    Albumin 2.4 (*)    AST 163 (*)    Alkaline Phosphatase 359 (*)    Anion gap 16 (*)    All other components within normal limits  TROPONIN T, HIGH SENSITIVITY - Abnormal; Notable for the following components:   Troponin T High Sensitivity 39 (*)    All other components within normal limits  MAGNESIUM     MDM  Given 1 bolus of IV fluids and IV morphine   Plan for IV fluid given signs of dehydration.  Will add on lab work.  Have a low suspicion for ACS.  Plan for CTA to evaluate for pulmonary embolism and pneumonia.  Consulted palliative/hospice care and waiting for callback.  Long discussion with family members and patient at bedside.  Discussed that I do not believe that the pulmonary embolism would overall improve her longevity and is small and subsegmental.  Do not believe that this is causing her pain or symptoms today.  Ultimately patient would want treatment for her pulmonary embolism.  Started on heparin .  Their ultimate goal is home hospice.  Discussed with hospice team who will continue to follow with the [facility] care.  Consulted  hospitalist for admission for pulmonary embolism and malignancy with dehydration  Clinical Course as of 06/17/24 1121  Tue Jun 17, 2024  1120 After further conversation with palliative and hospice who came and talk to the patient in the emergency department.  They wish  for no longer treatment.  They will go home with hospice in place and have a complete understanding that there will be no further treatments and no treatment for her pulmonary embolism.  They expressed understanding and just want to go home.  They will help coordinate with hospital beds and oral morphine . [SM]    Clinical Course User Index [SM] Suzanne Kirsch, MD     PROCEDURES:  Critical Care performed: yes  .Critical Care  Performed by: Suzanne Kirsch, MD Authorized by: Suzanne Kirsch, MD   Critical care provider statement:    Critical care time (minutes):  45   Critical care time was exclusive of:  Separately billable procedures and treating other patients   Critical care was necessary to treat or prevent imminent or life-threatening deterioration of the following conditions:  Respiratory failure and circulatory failure   Critical care was time spent personally by me on the following activities:  Development of treatment plan with patient or surrogate, discussions with consultants, evaluation of patient's response to treatment, examination of patient, ordering and review of laboratory studies, ordering and review of radiographic studies, ordering and performing treatments and interventions, pulse oximetry, re-evaluation of patient's condition and review of old charts   Care discussed with: admitting provider     Patient's presentation is most consistent with acute presentation with potential threat to life or bodily function.   MEDICATIONS ORDERED IN ED: Medications  ondansetron  (ZOFRAN ) injection 4 mg (4 mg Intravenous Given 06/17/24 0653)  morphine  (PF) 2 MG/ML injection 2 mg (2 mg Intravenous Given 06/17/24 0655)   lactated ringers  bolus 1,000 mL (0 mLs Intravenous Stopped 06/17/24 0909)  morphine  (PF) 2 MG/ML injection 2 mg (2 mg Intravenous Given 06/17/24 0747)  iohexol  (OMNIPAQUE ) 350 MG/ML injection 45 mL (45 mLs Intravenous Contrast Given 06/17/24 0850)  morphine  (PF) 2 MG/ML injection 2 mg (2 mg Intravenous Given 06/17/24 0910)    FINAL CLINICAL IMPRESSION(S) / ED DIAGNOSES   Final diagnoses:  Acute pulmonary embolism without acute cor pulmonale, unspecified pulmonary embolism type (HCC)  Dyspnea, unspecified type  Dehydration     Rx / DC Orders   ED Discharge Orders     None        Note:  This document was prepared using Dragon voice recognition software and may include unintentional dictation errors.   Suzanne Kirsch, MD 06/17/24 1048    Suzanne Kirsch, MD 06/17/24 1121

## 2024-06-17 NOTE — ED Notes (Signed)
 [facility]  called for  transport home

## 2024-06-17 NOTE — Progress Notes (Signed)
 [facility], Room ED 26 [facility] Liaison Note  Received request from Nathanael Ring, RN, Transitions of Care Manager, for hospice services at home after discharge.  Spoke with patient and family  to initiate education related to hospice philosophy, services, and team approach to care.  Patient/family verbalized understanding of information given.  Per discussion, the plan is for discharge home today via [facility] after DME is delivered.    DME needs discussed.  Patient has the following equipment in the home: [facility] and walker.    Patient/family requests the following equipment in the home: home 02, hospital bed, over bed table, and transport WC.    The address has been verified and is correct in the chart. [name] Forest and phone number [phone] is the family contact to arrange time of equipment delivery.    Please send signed and completed DNR home with the patient/family.  Please provide prescriptions at discharge as needed to ensure ongoing symptom management.   [facility] information and contact numbers given to  [employer].  Above information shared with Nathanael Ring, RN, Transitions of Care Manager.     Please call with any Hospice related questions or concerns.  Thank you for the opportunity to participate in this patient's care.  Marinell Nova, [facility] Liaison [phone]

## 2024-06-17 NOTE — Progress Notes (Signed)
 PHARMACY - ANTICOAGULATION CONSULT NOTE  Pharmacy Consult for Heparin  Indication: pulmonary embolus  No Known Allergies  Patient Measurements: Height: 4' 9.01 (144.8 cm) Weight: 43.1 kg (95 lb) IBW/kg (Calculated) : 38.62  Vital Signs: Temp: 98.6 F (37 C) (11/25 0642) BP: 105/71 (11/25 1030) Pulse Rate: 117 (11/25 1030)  Labs: Recent Labs    06/17/24 0643  HGB 9.6*  HCT 31.0*  PLT 370  CREATININE <0.30*    CrCl cannot be calculated (This lab value cannot be used to calculate CrCl because it is not a number: <0.30).   Medical History: Past Medical History:  Diagnosis Date   Diabetes mellitus without complication (HCC)     Medications:  (Not in a hospital admission)  Scheduled:  Infusions:  PRN:  Anti-infectives (From admission, onward)    None       Assessment: Patient is a 48 yo F presenting with CC of pain. PMH significant for non-small cell lung cancer with adenocarcinoma (scheduled to start hospice this week). Imaging was done, which is concerning for PE and PNA. Hospice/palliative care is consulted. Patient currently wants treatment for her PE. Not on any anticoagulation PTA. Pharmacy is consulted for heparin  dosing and monitoring.  Baseline CBC w/ stable Hgb and PLTS Baseline aPTT and INR ordered as add-on labs    Goal of Therapy:  Heparin  level 0.3-0.7 units/ml Monitor platelets by anticoagulation protocol: Yes   Plan:  - Will give Heparin  3000 units bolus x 1 - Will start Heparin  gtt rate at 750 units/hr - Will check a HL in 6 hours after starting heparin  infusion - Will monitor CBC daily    Ransom Blanch PGY-1 Pharmacy Resident  [place] [facility]  06/17/2024 11:04 AM

## 2024-06-17 NOTE — TOC Transition Note (Signed)
 Transition of Care [facility]) - Discharge Note   Patient Details  Name: Hailey Wise MRN: [id number] Date of Birth: [birth year]  Transition of Care [facility]) CM/SW Contact:  Nathanael CHRISTELLA Ring, RN Phone Number: 06/17/2024, 4:11 PM   Clinical Narrative:    Patient accepted by [facility] plan to discharge home tonight.  Equipment is scheduled to be delivered to the home between 4-6 PM.  Family says that they do not need to wait until equipment delivered to get her home and we can go ahead and arrange [facility].  ED secretary will arrange transport.  Nurse notifed of plan.    Final next level of care: Home w Hospice Care Barriers to Discharge: Barriers Resolved   Patient Goals and CMS Choice Patient states their goals for this hospitalization and ongoing recovery are:: To go home with hospice CMS Medicare.gov Compare Post Acute Care list provided to:: Patient Choice offered to / list presented to : Patient      Discharge Placement                Patient to be transferred to facility by: Going home by [facility] non emergent EMS Name of family member notified: husband and son Patient and family notified of of transfer: 06/17/24  Discharge Plan and Services Additional resources added to the After Visit Summary for   In-house Referral: Hospice / Palliative Care, Interpreting Services Discharge Planning Services: CM Consult Post Acute Care Choice: Hospice          DME Arranged: Hospital bed, Oxygen, Wheelchair manual DME Agency: Other - Comment [facility]) Date DME Agency Contacted: 06/17/24 Time DME Agency Contacted: 1145 Representative spoke with at DME Agency: Marinell            Social Drivers of Health (SDOH) Interventions SDOH Screenings   Food Insecurity: No Food Insecurity (02/15/2024)   Received from [facility]  Housing: Low Risk  (10/18/2023)   Received from [facility]  Transportation Needs: No Transportation Needs (02/15/2024)    Received from [facility]  Utilities: Low Risk (02/15/2024)   Received from [facility]  Financial Resource Strain: Low Risk (02/15/2024)   Received from [facility]  Tobacco Use: Low Risk  (06/17/2024)  Health Literacy: Low Risk (02/15/2024)   Received from [facility]     Readmission Risk Interventions     No data to display

## 2024-06-17 NOTE — ED Notes (Signed)
 This EDT rounded on this pt, pt is currently resting @ bedside with family. Bed alarm is on & call light within reach.
